# Patient Record
Sex: Female | Born: 1985 | Race: Black or African American | Hispanic: No | Marital: Single | Smoking: Never smoker
Health system: Southern US, Community
[De-identification: ages and names within clinical notes are randomized; demographics above are authoritative.]

## PROBLEM LIST (undated history)

## (undated) DIAGNOSIS — D259 Leiomyoma of uterus, unspecified: Secondary | ICD-10-CM

## (undated) DIAGNOSIS — Z309 Encounter for contraceptive management, unspecified: Secondary | ICD-10-CM

## (undated) HISTORY — DX: Leiomyoma of uterus, unspecified: D25.9

## (undated) HISTORY — DX: Encounter for contraceptive management, unspecified: Z30.9

---
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
## 2007-01-16 ENCOUNTER — Emergency Department (HOSPITAL_COMMUNITY): Admission: EM | Admit: 2007-01-16 | Discharge: 2007-01-16 | Payer: Self-pay | Admitting: Emergency Medicine

## 2007-04-14 ENCOUNTER — Emergency Department (HOSPITAL_COMMUNITY): Admission: EM | Admit: 2007-04-14 | Discharge: 2007-04-14 | Payer: Self-pay | Admitting: Emergency Medicine

## 2008-05-15 ENCOUNTER — Ambulatory Visit: Payer: Self-pay | Admitting: Family Medicine

## 2008-06-15 ENCOUNTER — Ambulatory Visit: Payer: Self-pay | Admitting: Family Medicine

## 2008-06-22 ENCOUNTER — Ambulatory Visit: Payer: Self-pay | Admitting: Family Medicine

## 2009-01-31 ENCOUNTER — Ambulatory Visit: Payer: Self-pay | Admitting: Family Medicine

## 2009-05-14 ENCOUNTER — Encounter: Payer: Self-pay | Admitting: Family Medicine

## 2009-05-14 ENCOUNTER — Other Ambulatory Visit: Admission: RE | Admit: 2009-05-14 | Discharge: 2009-05-14 | Payer: Self-pay | Admitting: Family Medicine

## 2009-05-14 ENCOUNTER — Ambulatory Visit: Payer: Self-pay | Admitting: Family Medicine

## 2009-05-14 LAB — HM PAP SMEAR: HM Pap smear: NEGATIVE

## 2010-03-07 ENCOUNTER — Ambulatory Visit: Payer: Self-pay | Admitting: Family Medicine

## 2010-08-14 ENCOUNTER — Ambulatory Visit: Payer: Self-pay | Admitting: Family Medicine

## 2011-05-19 DIAGNOSIS — R87619 Unspecified abnormal cytological findings in specimens from cervix uteri: Secondary | ICD-10-CM | POA: Insufficient documentation

## 2011-08-01 ENCOUNTER — Encounter: Payer: Self-pay | Admitting: Family Medicine

## 2011-08-04 ENCOUNTER — Encounter: Payer: Self-pay | Admitting: Medical

## 2011-08-04 ENCOUNTER — Ambulatory Visit (INDEPENDENT_AMBULATORY_CARE_PROVIDER_SITE_OTHER): Payer: BC Managed Care – PPO | Admitting: Medical

## 2011-08-04 DIAGNOSIS — G43909 Migraine, unspecified, not intractable, without status migrainosus: Secondary | ICD-10-CM

## 2011-08-04 MED ORDER — RIZATRIPTAN BENZOATE 10 MG PO TABS: 10.0000 mg | ORAL_TABLET | Freq: Once | ORAL | Status: DC | PRN

## 2011-08-04 NOTE — Patient Instructions (Signed)
Migraine Headache I want you to keep a headache diary for the next month.     Write down the following with your headaches:  Time of day  Triggers - stress, lack of sleep, smells, bright lights, etc.  Quality of headache - throbbing, aching, sharp, comes and goes, etc.  What did you do or take for the headache, and did it help?  Other factors you think are pertinent  Try and get 7+ hours of sleep nightly.  Eat a consistent and healthy diet.  Don't abruptly change caffeine intake.  I gave you samples of Maxalt today.  You can use 1 tablet with the onset of a bad headache.   If needed, you can repeat this in 2 hours.  Take no more than 2 of these in a 24 hour period.  A migraine headache is an intense, throbbing pain on one or both sides of your head. The exact cause of a migraine headache is not always known. A migraine may be caused when nerves in the brain become irritated and release chemicals that cause swelling (inflammation) within blood vessels, causing pain. Many migraine sufferers have a family history of migraines. Before you get a migraine you may or may not get an aura. An aura is a group of symptoms that can predict the beginning of a migraine. An aura may include:  Visual changes such as:   Flashing lights.   Seeing bright spots or zig-zag lines.   Tunnel vision.   Feelings of numbness.   Trouble talking.   Muscle weakness.  SYMPTOMS OF A MIGRAINE  A migraine headache has one or more of the following symptoms:  Pain on one or both sides of your head.   Pain that is pulsating or throbbing in nature.   Pain that is severe enough to prevent daily activities.   Pain that is aggravated by any daily physical activity.   Nausea (feeling sick to your stomach), vomiting or both.   Pain with exposure to bright lights, loud noises or activity.   General sensitivity to bright lights or loud noises.  MIGRAINE TRIGGERS A migraine headache can be triggered by many  things. Examples of triggers include:   Alcohol.   Smoking.   Stress.   It may be related to menses (female menstruation).   Aged cheeses.   Foods or drinks that contain nitrates, glutamate, aspartame or tyramine.   Lack of sleep.   Chocolate.   Caffeine.   Hunger.   Medications such as nitroglycerine (used to treat chest pain), birth control pills, estrogen and some blood pressure medications.  DIAGNOSIS  A migraine headache is often diagnosed based on:   Your symptoms.   Physical examination.   A CT scan of your head may be ordered to see if your headaches are caused from other medical conditions.  HOME CARE INSTRUCTIONS  Medications can help prevent migraines if they are recurrent or should they become recurrent. Your caregiver can help you with a medication or treatment program that will be helpful to you.   If you get a migraine, it may be helpful to lie down in a dark, quiet room.   It may be helpful to keep a headache diary. This may help you find a trend as to what may be triggering your headaches.  SEEK IMMEDIATE MEDICAL CARE IF:   You do not get relief from the medications given to you or you have a recurrence of pain.   You have confusion, personality changes  or seizures.   You have headaches that wake you from sleep.   You have an increased frequency in your headaches.   You have a stiff neck.   You have a loss of vision.   You have muscle weakness.   You start losing your balance or have trouble walking.   You feel faint or pass out.  MAKE SURE YOU:   Understand these instructions.   Will watch your condition.   Will get help right away if you are not doing well or get worse.  Document Released: 11/24/2005 Document Re-Released: 09/21/2009 [facility] Patient Information 2011 [place], [place].

## 2011-08-04 NOTE — Progress Notes (Signed)
  Subjective:    Stephanie Austin is a 25 y.o. female who presents for evaluation of headache. Symptoms began about 2 years ago ago. Generally, the headaches last hours to 1-2 days, but recently has gotten bad headache lasting 4 days. Lately getting headaches about 2x/wk. The headaches are usually worse in the evening. The headaches are usually throbbing and are located in frontal to bitemporal region.  Gets some photophobia and phonophobia.  Usually not affected by periods.   She denies changes in caffeine, no fasting, no allergy problems.  She does drink 3 colas daily.  Work attendance or other daily activities are affected by the headaches.  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  She notes getting about 5.5 hours of sleep nightly. Precipitating factors include: none which have been determined. The headaches are usually not preceded by an aura. Associated neurologic symptoms: none. The patient denies decreased physical activity, depression, dizziness, muscle weakness, numbness of extremities, speech difficulties and vision problems. Home treatment has included Ibuprofen, Tylenol, Excedrin with some improvement. Other history includes: nothing pertinent. Family history includes brohter wiht hx/o migraines.  The following portions of the patient's history were reviewed and updated as appropriate: allergies, current medications, past family history, past medical history, past social history, past surgical history and problem list.  Review of Systems General: No fevers, chills, sweats, weight changes Skin: No rash, skin changes HEENT: No ear pain, eye changes, sore throat, congestion Lungs: No cough, shortness of breath Heart: No chest pain, palpitations Abdomen: No abdominal pain, nausea, vomiting, bowel changes Neurology: No numbness, weakness, tingling, vision changes, hearing changes, speech changes   Objective:   Gen: Alert, well-nourished, no acute distress HEENT: conjunctiva normal, TMs  pearly, nares patent, pharynx normal Neck: No JVD, no bruit, supple, no thyromegaly, no masses Lungs: Clear to auscultation bilaterally Heart: RRR, normal S1, S2 Ext: no edema Neuro: CN II through XII intact, no focal change   Assessment:     Encounter Diagnosis  Name Primary?  . Migraine Yes     Plan:  Advise that her symptoms are most suggestive of migraines.  Advise she keep a headache diary.  Gave samples of Maxalt for her to try with the onset of next migraine.  Advise she work on sleep hygiene, try and get 7+ hours of sleep nightly, eat a consistent diet, no sudden changes in caffeine.  Use over-the-counter analgesics sparingly.  Recheck in one month

## 2011-09-03 ENCOUNTER — Ambulatory Visit: Payer: BC Managed Care – PPO | Admitting: Medical

## 2015-03-16 ENCOUNTER — Other Ambulatory Visit: Payer: Self-pay | Admitting: Obstetrics and Gynecology

## 2015-03-16 DIAGNOSIS — E049 Nontoxic goiter, unspecified: Secondary | ICD-10-CM

## 2015-03-21 ENCOUNTER — Other Ambulatory Visit: Payer: Self-pay

## 2015-03-23 ENCOUNTER — Ambulatory Visit
Admission: RE | Admit: 2015-03-23 | Discharge: 2015-03-23 | Disposition: A | Discharge status: Home / Self Care | Payer: Managed Care, Other (non HMO) | Source: Ambulatory Visit | Attending: Obstetrics and Gynecology | Admitting: Obstetrics and Gynecology

## 2015-03-23 DIAGNOSIS — E049 Nontoxic goiter, unspecified: Secondary | ICD-10-CM

## 2015-04-20 ENCOUNTER — Encounter: Payer: Self-pay | Admitting: Endocrinology

## 2015-04-20 ENCOUNTER — Ambulatory Visit (INDEPENDENT_AMBULATORY_CARE_PROVIDER_SITE_OTHER): Payer: Managed Care, Other (non HMO) | Admitting: Endocrinology

## 2015-04-20 VITALS — BP 136/80 | HR 101 | Temp 98.6°F | Ht 63.0 in | Wt 209.0 lb

## 2015-04-20 DIAGNOSIS — R0600 Dyspnea, unspecified: Secondary | ICD-10-CM | POA: Diagnosis not present

## 2015-04-20 NOTE — Progress Notes (Signed)
   Subjective:    Patient ID: Stephanie Austin, female    DOB: 09/15/1986, 29 y.o.   MRN: [id number]  HPI Pt states 1 month of slight swelling at the anterior neck, but no assoc pain.  She has no h/o XRT or surgery to the neck.   Past Medical History  Diagnosis Date  . Contraception management     No past surgical history on file.  History   Social History  . Marital Status: Single    Spouse Name: N/A  . Number of Children: N/A  . Years of Education: N/A   Occupational History  . Not on file.   Social History Main Topics  . Smoking status: Never Smoker   . Smokeless tobacco: Never Used  . Alcohol Use: Yes     Comment: 2 drinks a month  . Drug Use: No  . Sexual Activity: Not on file   Other Topics Concern  . Not on file   Social History Narrative    Current Outpatient Prescriptions on File Prior to Visit  Medication Sig Dispense Refill  . rizatriptan (MAXALT) 10 MG tablet Take 1 tablet (10 mg total) by mouth once as needed for migraine. May repeat in 2 hours if needed 2 tablet 0   No current facility-administered medications on file prior to visit.    No Known Allergies  Family History  Problem Relation Age of Onset  . Thyroid disease Mother     BP 136/80 mmHg  Pulse 101  Temp(Src) 98.6 F (37 C) (Oral)  Ht 5\' 3"  (1.6 m)  Wt 209 lb (94.802 kg)  BMI 37.03 kg/m2  SpO2 97%     Review of Systems She has slight doe.  No dysphagia.     Objective:   Physical Exam VITAL SIGNS:  See vs page GENERAL: no distress NECK: There is slight diffuse palpable thyroid enlargement.  No thyroid nodule is palpable.  No palpable lymphadenopathy at the anterior neck.   CHEST WALL: no deformity MUSCULOSKELETAL: muscle bulk and strength are grossly normal.  no obvious joint swelling.  gait is normal and steady EXTEMITIES: no edema NEURO:  cn 2-12 grossly intact.   readily moves all 4's.  sensation is intact to touch on the feet SKIN:  Normal texture and temperature.  No  rash or suspicious lesion is visible.   NODES:  None palpable at the neck PSYCH: alert, well-oriented.  Does not appear anxious nor depressed.  i reviewed thyroid US report.  i personally reviewed spirometry tracing: normal  outside test results are reviewed: TSH=normal    Assessment & Plan:  Goiter, diffuse: prob autoimmune. Mild sob: new to me: not thyroid-related  Patient is advised the following: Patient Instructions  Your thyroid level is normal now.  However, it will probably become abnormal with time, so you should have annual thyroid blood tests.  You should also have a PCP examine your neck each year.  I would be happy to see you back here whenever you want.    Cc elmira powell

## 2015-04-20 NOTE — Patient Instructions (Addendum)
Your thyroid level is normal now.  However, it will probably become abnormal with time, so you should have annual thyroid blood tests.  You should also have a PCP examine your neck each year.  I would be happy to see you back here whenever you want.    Cc elmira powell

## 2017-01-15 DIAGNOSIS — R7309 Other abnormal glucose: Secondary | ICD-10-CM | POA: Insufficient documentation

## 2017-01-15 DIAGNOSIS — E049 Nontoxic goiter, unspecified: Secondary | ICD-10-CM | POA: Insufficient documentation

## 2018-01-04 DIAGNOSIS — E781 Pure hyperglyceridemia: Secondary | ICD-10-CM | POA: Insufficient documentation

## 2018-10-26 ENCOUNTER — Other Ambulatory Visit: Payer: Self-pay

## 2018-10-26 ENCOUNTER — Encounter (HOSPITAL_COMMUNITY): Payer: Self-pay

## 2018-10-26 ENCOUNTER — Inpatient Hospital Stay (HOSPITAL_COMMUNITY)
Admission: AD | Admit: 2018-10-26 | Discharge: 2018-10-26 | Disposition: A | Discharge status: Home / Self Care | Payer: Managed Care, Other (non HMO) | Source: Ambulatory Visit | Attending: Obstetrics & Gynecology | Admitting: Obstetrics & Gynecology

## 2018-10-26 DIAGNOSIS — R102 Pelvic and perineal pain: Secondary | ICD-10-CM

## 2018-10-26 DIAGNOSIS — R109 Unspecified abdominal pain: Secondary | ICD-10-CM | POA: Insufficient documentation

## 2018-10-26 DIAGNOSIS — N852 Hypertrophy of uterus: Secondary | ICD-10-CM | POA: Diagnosis not present

## 2018-10-26 MED ORDER — NAPROXEN 500 MG PO TBEC: 500.0000 mg | DELAYED_RELEASE_TABLET | Freq: Two times a day (BID) | ORAL | 3 refills | Status: DC

## 2018-10-26 MED ORDER — HYDROCODONE-ACETAMINOPHEN 5-325 MG PO TABS: 1.0000 | ORAL_TABLET | Freq: Four times a day (QID) | ORAL | 0 refills | Status: AC | PRN

## 2018-10-26 NOTE — MAU Provider Note (Signed)
History     CSN: [id number]  Arrival date and time: 10/26/18 1539   Chief Complaint  Patient presents with  . Abdominal Pain   HPI  32 year old P0 presents via ambulance as a transfer of care from [facility] Emergency Department for severe abdominal pain that began at 6am this morning.  Patient noted it to be severe sharp, worse in the RLQ.  She notes that she has never had this type of pain in the past.  She has known fibroid uterus that has grown in size in the past two years.  She is on continuous Norethindrone.  She denies abnormal uterine bleeding, states she has always had normal menses.   She has a history of severe dysmenorrhea and dyspareunia.   She denies nausea or vomiting.  She felt hot earlier but now she feels better after being medicated and has no pain.    She reports normal urinary and defecatory function. Denies constipation, melena or hematochezia. She does have mild early satiety. She has some LE weakness and numbness at times.   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 Contraception management     History reviewed. No pertinent surgical history.  Family History  Problem Relation Age of Onset  . Thyroid disease Mother     Social History   Tobacco Use  . Smoking status: Never Smoker  . Smokeless tobacco: Never Used  Substance Use Topics  . Alcohol use: Not Currently    Comment: 2 drinks a month  . Drug use: No    Allergies: No Known Allergies  Medications Prior to Admission  Medication Sig Dispense Refill Last Dose  . CAMILA 0.35 MG tablet    10/25/2018 at Unknown time  . Multiple  Vitamins-Minerals (MULTIVITAMIN GUMMIES ADULT) CHEW Chew 1 tablet by mouth daily.   10/26/2018 at Unknown time  . Omega-3 Fatty Acids (FISH OIL ADULT GUMMIES PO) Take 2 tablets by mouth daily.   10/25/2018 at Unknown time    Review of Systems  As per HPI  Physical Exam   Blood pressure 130/82, pulse (!) 107, temperature 98 F (36.7 C), resp. rate 12, SpO2 97 %.  Physical Exam  Abdomen: Soft, NT, Non-distended, palpable fundus just beneath the umbilicus.  The uterus is palpable to be broad encompassing the entire lower abdomen / pelvis  Pelvic exam deferred  CT scan from [facility]:  Normal appendix Severely enlarged heterogeneous uterus with numerous masses with the overall uterus measuring apprx 8.9x17x13.9 cm concerning for numerous leiomyoma with myxoid degeneration versus leiomyosarcoma  Assessment and Plan  32 yo with markedly enlarged uterus, fast growing. Pain is controlled currently. Discussed with patient that the pain is most likely inflammatory due to fibroid degeneration and will send home with NSAIDs and PRN narcotic pain medication.   We discussed at length the possible diagnosis of leiomyosarcoma and the potential indication.  We reviewed that the fact that she does not have abnormal uterine bleeding is reassuring. However I am referring her to Dr. Everitt Amber, GYN Oncologist at [facility].  Discussed case with Dr. Denman George via phone and patient has an appointment Monday at 1300.  We discussed that she will probably need further testing  to include endometrial biopsy, possible MRI.  We also discussed preserving her fertility versus performing a hysterectomy.  Patient is unsure about her future fertility at this time, but she is obviously a young, single nulliparous patient.   Will fax over her records to the [facility].    Miette Molenda, [place] 10/26/2018, 5:01 PM

## 2018-10-26 NOTE — MAU Note (Signed)
Urine sent to lab 

## 2018-10-26 NOTE — MAU Note (Signed)
Pt states she is here to be further evaluated for her uterine sarcoma.   Pt transferred from [facility] Emergency Department.

## 2018-11-01 ENCOUNTER — Encounter: Payer: Self-pay | Admitting: Gynecologic Oncology

## 2018-11-01 ENCOUNTER — Inpatient Hospital Stay: Payer: Managed Care, Other (non HMO) | Attending: Gynecologic Oncology | Admitting: Gynecologic Oncology

## 2018-11-01 VITALS — BP 148/90 | HR 109 | Temp 98.8°F | Resp 20 | Wt 198.3 lb

## 2018-11-01 DIAGNOSIS — D252 Subserosal leiomyoma of uterus: Secondary | ICD-10-CM

## 2018-11-01 DIAGNOSIS — D251 Intramural leiomyoma of uterus: Secondary | ICD-10-CM | POA: Diagnosis not present

## 2018-11-01 DIAGNOSIS — D250 Submucous leiomyoma of uterus: Secondary | ICD-10-CM | POA: Diagnosis not present

## 2018-11-01 NOTE — Progress Notes (Signed)
Consult Note: Gyn-Onc  Consult was requested by Dr. Alwyn Pea for the evaluation of Stephanie Austin 32 y.o. female  CC:  Chief Complaint  Patient presents with  . Fibroids    consult for possible leiomyosarcoma    Assessment/Plan:  Stephanie Austin  is a 37 y.o.  year old with rapidly enlarging uterine fibroids associated with intermittent pain.  Patient provided her disc from her recent CT scan which we will submit to radiology for comparison.  I have ordered an MRI of the pelvis to better evaluate the characterization of these fibroids.  I discussed the limitations of the MRI and that it is not diagnostic for confirming leiomyosarcoma versus benign fibroids.  However, it will give [place] some better classification of her anatomy.  Additionally we will follow-up the biopsy results from today.  I discussed the limitation of endometrial biopsy in the diagnosis of leiomyosarcoma (sensitivity 50%) however if positive this will establish the diagnosis and we will better understand how to proceed.  I discussed that there are 3 options moving forward are either #1 if the MRI of biopsy are reassuring we can either move towards close follow-up or abdominal myomectomy both of which of fertility preserving options.  If the MRI or biopsy are concerning we should proceed with an abdominal hysterectomy.  I feel that this might be able to be done through a combined approach of minimally invasive surgery for mobilization of the vascular structures and colpotomy and then mini laparotomy for an intact specimen removal (no morcellation).  This might enable a less morbid, low transverse incision as part of the procedure.  I discussed that hysterectomy result in permanent infertility.  We will any entertain this option if safe myomectomy or close observation is not reasonable.  HPI: Stephanie Austin is a 48-year-old P0 who was seen in consultation at the request of Dr.Pinn rapidly enlarging uterine fibroids.  The patient is  known to have fibroid uterus since at least 2016 when she had an ultrasound scan.  She presented to her gynecologist with pain in September 2019 and a transvaginal ultrasound was performed at that time which showed a uterus measuring 13.5 cm from fundus to external awes with multiple intramural subserosal and submucosal fibroids.  The endometrium appeared grossly normal.  She denied abnormal vaginal bleeding.  The ovaries were normal.  The pain resolved and no intervention was taken at that time.  However, on October 26, 2018 she developed severe right lower quadrant pain with tenderness and underwent a CT scan of the abdomen and pelvis.  This was performed at [facility] rocking him.  It revealed an enlarged heterogeneous uterus with numerous masses.  The uterus measured 8.9 x 17.13 0.9 cm.  It was concerning in the report for either fibroids versus leiomyosarcoma.  There are no other abnormality seen within the abdomen and pelvis.  The appendix was normal.  The radiologist felt there were concerning fibroids for myxoid degeneration.  After 24 hours the pain resolved and patient now feels well.  She denies abnormal uterine bleeding.  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  Her last Pap in September 2019 was ASCUS but with negative high-risk HPV.  She is a non-smoker.  She does not take regular medications other than an oral contraceptive pill for contraception.  Current Meds:  Outpatient Encounter Medications as of 11/01/2018  Medication Sig  . CAMILA  0.35 MG tablet   . HYDROcodone-acetaminophen (NORCO/VICODIN) 5-325 MG tablet Take 1-2 tablets by mouth every 6 (six) hours as needed for up to 7 days for moderate pain or severe pain.  . Multiple Vitamins-Minerals (MULTIVITAMIN GUMMIES ADULT) CHEW Chew 1 tablet by mouth daily.  . naproxen (EC-NAPROXEN) 500 MG EC tablet Take 1 tablet (500 mg total) by mouth every 12  (twelve) hours. Do not take along with other anti-inflammatories such as advil or ibuprofen  . Omega-3 Fatty Acids (FISH OIL ADULT GUMMIES PO) Take 2 tablets by mouth daily.   No facility-administered encounter medications on file as of 11/01/2018.     Allergy: No Known Allergies  Social Hx:   Social History   Socioeconomic History  . Marital status: Single    Spouse name: Not on file  . Number of children: Not on file  . Years of education: Not on file  . Highest education level: Not on file  Occupational History  . Not on file  Social Needs  . Financial resource strain: Not on file  . Food insecurity:    Worry: Not on file    Inability: Not on file  . Transportation needs:    Medical: Not on file    Non-medical: Not on file  Tobacco Use  . Smoking status: Never Smoker  . Smokeless tobacco: Never Used  Substance and Sexual Activity  . Alcohol use: Not Currently    Comment: 2 drinks a month  . Drug use: No  . Sexual activity: Not Currently    Birth control/protection: Pill  Lifestyle  . Physical activity:    Days per week: Not on file    Minutes per session: Not on file  . Stress: Not on file  Relationships  . Social connections:    Talks on phone: Not on file    Gets together: Not on file    Attends religious service: Not on file    Active member of club or organization: Not on file    Attends meetings of clubs or organizations: Not on file    Relationship status: Not on file  . Intimate partner violence:    Fear of current or ex partner: Not on file    Emotionally abused: Not on file    Physically abused: Not on file    Forced sexual activity: Not on file  Other Topics Concern  . Not on file  Social History Narrative  . Not on file    Past Surgical Hx: History reviewed. No pertinent surgical history.  Past Medical Hx:  Past Medical History:  Diagnosis Date  . Contraception management   . Fibroid uterus     Past Gynecological History:  See HPI No  LMP recorded.  Family Hx:  Family History  Problem Relation Age of Onset  . Thyroid disease Mother     Review of Systems:  Constitutional  Feels well,    ENT Normal appearing ears and nares bilaterally Skin/Breast  No rash, sores, jaundice, itching, dryness Cardiovascular  No chest pain, shortness of breath, or edema  Pulmonary  No cough or wheeze.  Gastro Intestinal  No nausea, vomitting, or diarrhoea. No bright red blood per rectum, no abdominal pain, change in bowel movement, or constipation.  Genito Urinary  No frequency, urgency, dysuria,  Musculo Skeletal  No myalgia, arthralgia, joint swelling or pain  Neurologic  No weakness, numbness, change in gait,  Psychology  No depression, anxiety, insomnia.   Vitals:  Blood pressure (!) 148/90,  pulse (!) 109, temperature 98.8 F (37.1 C), temperature source Oral, resp. rate 20, weight 198 lb 5 oz (90 kg).  Physical Exam: WD in NAD Neck  Supple NROM, without any enlargements.  Lymph Node Survey No cervical supraclavicular or inguinal adenopathy Cardiovascular  Pulse normal rate, regularity and rhythm. S1 and S2 normal.  Lungs  Clear to auscultation bilateraly, without wheezes/crackles/rhonchi. Good air movement.  Skin  No rash/lesions/breakdown  Psychiatry  Alert and oriented to person, place, and time  Abdomen  Normoactive bowel sounds, abdomen soft, non-tender and mildly obese without evidence of hernia. Can feel uterus in lower left abdomin - mobile. Back No CVA tenderness Genito Urinary  Vulva/vagina: Normal external female genitalia.  No lesions. No discharge or bleeding.  Bladder/urethra:  No lesions or masses, well supported bladder  Vagina: normal  Cervix: Normal appearing, no lesions. Flush with vagina  Uterus: Bulky, mobile, no parametrial involvement or nodularity. Deviates to left. Myoma in lower uterine segment/cervix.  Adnexa: no discrete masses. Rectal  deferred Extremities  No bilateral  cyanosis, clubbing or edema.  Procedure Note:  Preop Dx: uterine fibroids, abnormal bleeding Postop Dx: same Procedure: endometrial biopsy Surgeon: Dorann Ou, MD EBL: minimal Specimens: endometrial biopsy to pathology Complications: none Procedure Details: Patient provided verbal consent, verbal timeout was performed.  Speculum was placed in the vagina and the cervix was visualized.  A Pipelle was passed a single time through the uterine cervix to a depth of 10 cm.  It was aspirated and a small amount of tissue was retrieved.  The pelvis removed as was a speculum.  The patient tolerated procedure well.    Thereasa Solo, MD  11/01/2018, 2:34 PM

## 2018-11-01 NOTE — Progress Notes (Signed)
See letter.

## 2018-11-01 NOTE — Patient Instructions (Addendum)
We will contact you with the results of your endometrial biopsy from today.  You may experience vaginal spotting from the biopsy.  We will also arrange for you to have an MRI of the pelvis and contact you with the results and Dr. Serita Grit recommendations.  Please call for any questions or concerns.   Endometrial Biopsy, Care After This sheet gives you information about how to care for yourself after your procedure. Your health care provider may also give you more specific instructions. If you have problems or questions, contact your health care provider. What can I expect after the procedure? After the procedure, it is common to have:  Mild cramping.  A small amount of vaginal bleeding for a few days. This is normal.  Follow these instructions at home:  Take over-the-counter and prescription medicines only as told by your health care provider.  Do not douche, use tampons, or have sexual intercourse until your health care provider approves.  Return to your normal activities as told by your health care provider. Ask your health care provider what activities are safe for you.  Follow instructions from your health care provider about any activity restrictions, such as restrictions on strenuous exercise or heavy lifting. Contact a health care provider if:  You have heavy bleeding, or bleed for longer than 2 days after the procedure.  You have bad smelling discharge from your vagina.  You have a fever or chills.  You have a burning sensation when urinating or you have difficulty urinating.  You have severe pain in your lower abdomen. Get help right away if:  You have severe cramps in your stomach or back.  You pass large blood clots.  Your bleeding increases.  You become weak or light-headed, or you pass out. Summary  After the procedure, it is common to have mild cramping and a small amount of vaginal bleeding for a few days.  Do not douche, use tampons, or have sexual  intercourse until your health care provider approves.  Return to your normal activities as told by your health care provider. Ask your health care provider what activities are safe for you. This information is not intended to replace advice given to you by your health care provider. Make sure you discuss any questions you have with your health care provider. Document Released: 09/14/2013 Document Revised: 12/10/2016 Document Reviewed: 12/10/2016 Elsevier Interactive Patient Education  2017   We are saving OR time on November 18, 2018 Preparing for your Surgery  Plan for surgery on with Dr. Everitt Amber at [facility] will be scheduled for a robotic assisted total hysterectomy, bilateral salpingectomy, mini-laparotomy.   Pre-operative Testing -You will receive a phone call from presurgical testing at [facility] to arrange for a pre-operative testing appointment before your surgery.  This appointment normally occurs one to two weeks before your scheduled surgery.   -Bring your insurance card, copy of an advanced directive if applicable, medication list  -At that visit, you will be asked to sign a consent for a possible blood transfusion in case a transfusion becomes necessary during surgery.  The need for a blood transfusion is rare but having consent is a necessary part of your care.     -You should not be taking blood thinners or aspirin at least ten days prior to surgery unless instructed by your surgeon.  Day Before Surgery at [facility] will be asked to take in a light diet the day before surgery.  Avoid carbonated beverages.  You will be advised to have nothing to eat or drink after midnight the evening before.    Eat a light diet the day before surgery.  Examples including soups, broths, toast, yogurt, mashed potatoes.  Things to avoid include carbonated beverages (fizzy beverages), raw fruits and raw vegetables, or beans.   If your bowels are filled with gas,  your surgeon will have difficulty visualizing your pelvic organs which increases your surgical risks.  Your role in recovery Your role is to become active as soon as directed by your doctor, while still giving yourself time to heal.  Rest when you feel tired. You will be asked to do the following in order to speed your recovery:  - Cough and breathe deeply. This helps toclear and expand your lungs and can prevent pneumonia. You may be given a spirometer to practice deep breathing. A staff member will show you how to use the spirometer. - Do mild physical activity. Walking or moving your legs help your circulation and body functions return to normal. A staff member will help you when you try to walk and will provide you with simple exercises. Do not try to get up or walk alone the first time. - Actively manage your pain. Managing your pain lets you move in comfort. We will ask you to rate your pain on a scale of zero to 10. It is your responsibility to tell your doctor or nurse where and how much you hurt so your pain can be treated.  Special Considerations -If you are diabetic, you may be placed on insulin after surgery to have closer control over your blood sugars to promote healing and recovery.  This does not mean that you will be discharged on insulin.  If applicable, your oral antidiabetics will be resumed when you are tolerating a solid diet.  -Your final pathology results from surgery should be available around one week after surgery and the results will be relayed to you when available.  -Dr. Lahoma Crocker is the Surgeon that assists your GYN Oncologist with surgery.  The next day after your surgery you will either see your GYN Oncologist, Dr. Precious Haws, or Dr. Lahoma Crocker.  -FMLA forms can be faxed to [phone] and please allow 5-7 business days for completion.   Blood Transfusion Information WHAT IS A BLOOD TRANSFUSION? A transfusion is the replacement of blood or  some of its parts. Blood is made up of multiple cells which provide different functions.  Red blood cells carry oxygen and are used for blood loss replacement.  White blood cells fight against infection.  Platelets control bleeding.  Plasma helps clot blood.  Other blood products are available for specialized needs, such as hemophilia or other clotting disorders. BEFORE THE TRANSFUSION  Who gives blood for transfusions?   You may be able to donate blood to be used at a later date on yourself (autologous donation).  Relatives can be asked to donate blood. This is generally not any safer than if you have received blood from a stranger. The same precautions are taken to ensure safety when a relative's blood is donated.  Healthy volunteers who are fully evaluated to make sure their blood is safe. This is blood bank blood. Transfusion therapy is the safest it has ever been in the practice of medicine. Before blood is taken from a donor, a complete history is taken to make sure that person has no history of diseases nor engages in risky social behavior (examples are intravenous drug  use or sexual activity with multiple partners). The donor's travel history is screened to minimize risk of transmitting infections, such as malaria. The donated blood is tested for signs of infectious diseases, such as HIV and hepatitis. The blood is then tested to be sure it is compatible with you in order to minimize the chance of a transfusion reaction. If you or a relative donates blood, this is often done in anticipation of surgery and is not appropriate for emergency situations. It takes many days to process the donated blood. RISKS AND COMPLICATIONS Although transfusion therapy is very safe and saves many lives, the main dangers of transfusion include:   Getting an infectious disease.  Developing a transfusion reaction. This is an allergic reaction to something in the blood you were given. Every precaution is  taken to prevent this. The decision to have a blood transfusion has been considered carefully by your caregiver before blood is given. Blood is not given unless the benefits outweigh the risks.

## 2018-11-05 ENCOUNTER — Telehealth: Payer: Self-pay | Admitting: *Deleted

## 2018-11-05 ENCOUNTER — Ambulatory Visit (HOSPITAL_COMMUNITY)
Admission: RE | Admit: 2018-11-05 | Discharge: 2018-11-05 | Disposition: A | Discharge status: Home / Self Care | Payer: Managed Care, Other (non HMO) | Source: Ambulatory Visit | Attending: Gynecologic Oncology | Admitting: Gynecologic Oncology

## 2018-11-05 DIAGNOSIS — D251 Intramural leiomyoma of uterus: Secondary | ICD-10-CM | POA: Diagnosis present

## 2018-11-05 DIAGNOSIS — D252 Subserosal leiomyoma of uterus: Secondary | ICD-10-CM | POA: Insufficient documentation

## 2018-11-05 DIAGNOSIS — D259 Leiomyoma of uterus, unspecified: Secondary | ICD-10-CM | POA: Insufficient documentation

## 2018-11-05 DIAGNOSIS — D250 Submucous leiomyoma of uterus: Secondary | ICD-10-CM | POA: Diagnosis present

## 2018-11-05 MED ORDER — GADOBUTROL 1 MMOL/ML IV SOLN
9.0000 mL | Freq: Once | INTRAVENOUS | Status: AC | PRN
  Administered 2018-11-05: 9 mL via INTRAVENOUS

## 2018-11-05 NOTE — Telephone Encounter (Signed)
Patient stated that she dropped off FMLA forms to the front desk.

## 2018-11-09 ENCOUNTER — Inpatient Hospital Stay: Payer: Managed Care, Other (non HMO) | Attending: Gynecologic Oncology | Admitting: Gynecologic Oncology

## 2018-11-09 ENCOUNTER — Encounter: Payer: Self-pay | Admitting: Gynecologic Oncology

## 2018-11-09 VITALS — BP 142/85 | HR 91 | Temp 98.7°F | Resp 20

## 2018-11-09 DIAGNOSIS — D252 Subserosal leiomyoma of uterus: Secondary | ICD-10-CM | POA: Insufficient documentation

## 2018-11-09 DIAGNOSIS — Z7189 Other specified counseling: Secondary | ICD-10-CM

## 2018-11-09 DIAGNOSIS — D250 Submucous leiomyoma of uterus: Secondary | ICD-10-CM | POA: Insufficient documentation

## 2018-11-09 DIAGNOSIS — D251 Intramural leiomyoma of uterus: Secondary | ICD-10-CM | POA: Insufficient documentation

## 2018-11-09 NOTE — Progress Notes (Signed)
Patient in the office with her mother for pre-op discussion.  She reports no changes since being last seen in the office.  Discussed the results of the endometrial biopsy taken in the office at her last visit with the results showing benign endocervical tissue.  Discussed MRI in detail including reviewing images.  Advised patient that her MRI had been reviewed by Dr. Denman George who stated that a myomectomy could be a possibility in order to preserve fertility.  Patient stating she would need some time to think which surgical route she would like to take.  All questions answered.  Patient asking about risks of surgery and what instructions would need to be followed after surgery.    Advised she would be contacted by Dr. Denman George via phone to discuss more in detail.  No concerns voiced at the visit.  Information given on the myomectomy procedure for her review. Advised to call for any needs.

## 2018-11-10 NOTE — Patient Instructions (Signed)
Stephanie Austin  11/10/2018   Your procedure is scheduled on: 11-18-18  Report to [facility] Main  Entrance  Report to admitting at      1100 AM    Call this number if you have problems the morning of surgery [phone]    Remember: Eat a light diet the day before surgery.  Examples including soups, broths, toast, yogurt, mashed potatoes.  Things to avoid include carbonated beverages (fizzy beverages), raw fruits and raw vegetables, or beans.   If your bowels are filled with gas, your surgeon will have difficulty visualizing your pelvic organs which increases your surgical risks.   NO SOLID FOOD AFTER MIDNIGHT THE NIGHT PRIOR TO SURGERY. NOTHING BY MOUTH EXCEPT CLEAR LIQUIDS UNTIL 3 HOURS PRIOR TO Long SURGERY. PLEASE FINISH ENSURE DRINK PER SURGEON ORDER 3 HOURS PRIOR TO SCHEDULED SURGERY TIME WHICH NEEDS TO BE COMPLETED AT ____________.1000 am then nothing by mouth     CLEAR LIQUID DIET   Foods Allowed                                                                     Foods Excluded  Coffee and tea, regular and decaf                             liquids that you cannot  Plain Jell-O in any flavor                                             see through such as: Fruit ices (not with fruit pulp)                                     milk, soups, orange juice  Iced Popsicles                                    All solid food Carbonated beverages, regular and diet                                    Cranberry, grape and apple juices Sports drinks like Gatorade Lightly seasoned clear broth or consume(fat free) Sugar, honey syrup   _____________________________________________________________________    BRUSH YOUR TEETH MORNING OF SURGERY AND RINSE YOUR MOUTH OUT, NO CHEWING GUM CANDY OR MINTS.     Take these medicines the morning of surgery with A SIP OF WATER: none                                You may not have any metal on your body including hair  pins and              piercings  Do not wear jewelry, make-up, lotions,  powders or perfumes, deodorant             Do not wear nail polish.  Do not shave  48 hours prior to surgery.            Do not bring valuables to the hospital. [facility].  Contacts, dentures or bridgework may not be worn into surgery.  Leave suitcase in the car. After surgery it may be brought to your room.                Please read over the following fact sheets you were given: _____________________________________________________________________         [facility] - Preparing for Surgery Before surgery, you can play an important role.  Because skin is not sterile, your skin needs to be as free of germs as possible.  You can reduce the number of germs on your skin by washing with CHG (chlorahexidine gluconate) soap before surgery.  CHG is an antiseptic cleaner which kills germs and bonds with the skin to continue killing germs even after washing. Please DO NOT use if you have an allergy to CHG or antibacterial soaps.  If your skin becomes reddened/irritated stop using the CHG and inform your nurse when you arrive at Short Stay. Do not shave (including legs and underarms) for at least 48 hours prior to the first CHG shower.  You may shave your face/neck. Please follow these instructions carefully:  1.  Shower with CHG Soap the night before surgery and the  morning of Surgery.  2.  If you choose to wash your hair, wash your hair first as usual with your  normal  shampoo.  3.  After you shampoo, rinse your hair and body thoroughly to remove the  shampoo.                           4.  Use CHG as you would any other liquid soap.  You can apply chg directly  to the skin and wash                       Gently with a scrungie or clean washcloth.  5.  Apply the CHG Soap to your body ONLY FROM THE NECK DOWN.   Do not use on face/ open                           Wound or open sores.  Avoid contact with eyes, ears mouth and genitals (private parts).                       Wash face,  Genitals (private parts) with your normal soap.             6.  Wash thoroughly, paying special attention to the area where your surgery  will be performed.  7.  Thoroughly rinse your body with warm water from the neck down.  8.  DO NOT shower/wash with your normal soap after using and rinsing off  the CHG Soap.                9.  Pat yourself dry with a clean towel.            10.  Wear clean pajamas.  11.  Place clean sheets on your bed the night of your first shower and do not  sleep with pets. Day of Surgery : Do not apply any lotions/deodorants the morning of surgery.  Please wear clean clothes to the hospital/surgery center.  FAILURE TO FOLLOW THESE INSTRUCTIONS MAY RESULT IN THE CANCELLATION OF YOUR SURGERY PATIENT SIGNATURE_________________________________  NURSE SIGNATURE__________________________________  ________________________________________________________________________  WHAT IS A BLOOD TRANSFUSION? Blood Transfusion Information  A transfusion is the replacement of blood or some of its parts. Blood is made up of multiple cells which provide different functions.  Red blood cells carry oxygen and are used for blood loss replacement.  White blood cells fight against infection.  Platelets control bleeding.  Plasma helps clot blood.  Other blood products are available for specialized needs, such as hemophilia or other clotting disorders. BEFORE THE TRANSFUSION  Who gives blood for transfusions?   Healthy volunteers who are fully evaluated to make sure their blood is safe. This is blood bank blood. Transfusion therapy is the safest it has ever been in the practice of medicine. Before blood is taken from a donor, a complete history is taken to make sure that person has no history of diseases nor engages in risky social behavior (examples are intravenous drug use  or sexual activity with multiple partners). The donor's travel history is screened to minimize risk of transmitting infections, such as malaria. The donated blood is tested for signs of infectious diseases, such as HIV and hepatitis. The blood is then tested to be sure it is compatible with you in order to minimize the chance of a transfusion reaction. If you or a relative donates blood, this is often done in anticipation of surgery and is not appropriate for emergency situations. It takes many days to process the donated blood. RISKS AND COMPLICATIONS Although transfusion therapy is very safe and saves many lives, the main dangers of transfusion include:   Getting an infectious disease.  Developing a transfusion reaction. This is an allergic reaction to something in the blood you were given. Every precaution is taken to prevent this. The decision to have a blood transfusion has been considered carefully by your caregiver before blood is given. Blood is not given unless the benefits outweigh the risks. AFTER THE TRANSFUSION  Right after receiving a blood transfusion, you will usually feel much better and more energetic. This is especially true if your red blood cells have gotten low (anemic). The transfusion raises the level of the red blood cells which carry oxygen, and this usually causes an energy increase.  The nurse administering the transfusion will monitor you carefully for complications. HOME CARE INSTRUCTIONS  No special instructions are needed after a transfusion. You may find your energy is better. Speak with your caregiver about any limitations on activity for underlying diseases you may have. SEEK MEDICAL CARE IF:   Your condition is not improving after your transfusion.  You develop redness or irritation at the intravenous (IV) site. SEEK IMMEDIATE MEDICAL CARE IF:  Any of the following symptoms occur over the next 12 hours:  Shaking chills.  You have a temperature by mouth  above 102 F (38.9 C), not controlled by medicine.  Chest, back, or muscle pain.  People around you feel you are not acting correctly or are confused.  Shortness of breath or difficulty breathing.  Dizziness and fainting.  You get a rash or develop hives.  You have a decrease in urine output.  Your urine turns a dark color  or changes to pink, red, or brown. Any of the following symptoms occur over the next 10 days:  You have a temperature by mouth above 102 F (38.9 C), not controlled by medicine.  Shortness of breath.  Weakness after normal activity.  The white part of the eye turns yellow (jaundice).  You have a decrease in the amount of urine or are urinating less often.  Your urine turns a dark color or changes to pink, red, or brown. Document Released: 11/21/2000 Document Revised: 02/16/2012 Document Reviewed: 07/10/2008 ExitCare Patient Information 2014 [place].  _______________________________________________________________________  Incentive Spirometer  An incentive spirometer is a tool that can help keep your lungs clear and active. This tool measures how well you are filling your lungs with each breath. Taking long deep breaths may help reverse or decrease the chance of developing breathing (pulmonary) problems (especially infection) following:  A long period of time when you are unable to move or be active. BEFORE THE PROCEDURE   If the spirometer includes an indicator to show your best effort, your nurse or respiratory therapist will set it to a desired goal.  If possible, sit up straight or lean slightly forward. Try not to slouch.  Hold the incentive spirometer in an upright position. INSTRUCTIONS FOR USE  1. Sit on the edge of your bed if possible, or sit up as far as you can in bed or on a chair. 2. Hold the incentive spirometer in an upright position. 3. Breathe out normally. 4. Place the mouthpiece in your mouth and seal your lips tightly  around it. 5. Breathe in slowly and as deeply as possible, raising the piston or the ball toward the top of the column. 6. Hold your breath for 3-5 seconds or for as long as possible. Allow the piston or ball to fall to the bottom of the column. 7. Remove the mouthpiece from your mouth and breathe out normally. 8. Rest for a few seconds and repeat Steps 1 through 7 at least 10 times every 1-2 hours when you are awake. Take your time and take a few normal breaths between deep breaths. 9. The spirometer may include an indicator to show your best effort. Use the indicator as a goal to work toward during each repetition. 10. After each set of 10 deep breaths, practice coughing to be sure your lungs are clear. If you have an incision (the cut made at the time of surgery), support your incision when coughing by placing a pillow or rolled up towels firmly against it. Once you are able to get out of bed, walk around indoors and cough well. You may stop using the incentive spirometer when instructed by your caregiver.  RISKS AND COMPLICATIONS  Take your time so you do not get dizzy or light-headed.  If you are in pain, you may need to take or ask for pain medication before doing incentive spirometry. It is harder to take a deep breath if you are having pain. AFTER USE  Rest and breathe slowly and easily.  It can be helpful to keep track of a log of your progress. Your caregiver can provide you with a simple table to help with this. If you are using the spirometer at home, follow these instructions: [place] IF:   You are having difficultly using the spirometer.  You have trouble using the spirometer as often as instructed.  Your pain medication is not giving enough relief while using the spirometer.  You develop fever of  100.5 F (38.1 C) or higher. SEEK IMMEDIATE MEDICAL CARE IF:   You cough up bloody sputum that had not been present before.  You develop fever of 102 F (38.9 C) or  greater.  You develop worsening pain at or near the incision site. MAKE SURE YOU:   Understand these instructions.  Will watch your condition.  Will get help right away if you are not doing well or get worse. Document Released: 04/06/2007 Document Revised: 02/16/2012 Document Reviewed: 06/07/2007 [facility] Patient Information 2014 [place], [place].   ________________________________________________________________________

## 2018-11-11 ENCOUNTER — Telehealth: Payer: Self-pay | Admitting: *Deleted

## 2018-11-11 ENCOUNTER — Telehealth: Payer: Self-pay | Admitting: Oncology

## 2018-11-11 NOTE — Telephone Encounter (Signed)
Patient called and requested something for nausea. Message forwarded to [facility] APP

## 2018-11-11 NOTE — Telephone Encounter (Signed)
Faxed referral for Dr. Delsa Sale to [facility].

## 2018-11-11 NOTE — Telephone Encounter (Signed)
Called Ms Oberry back to discuss nausea.  Stated that the offfice is closed and will follow up with her tomorrow. Suggested she push fluids and that there is a stomach virus going around .  Her fibroids would not be causing the nausea.

## 2018-11-15 ENCOUNTER — Telehealth: Payer: Self-pay | Admitting: Oncology

## 2018-11-15 ENCOUNTER — Encounter: Payer: Self-pay | Admitting: Oncology

## 2018-11-15 NOTE — Telephone Encounter (Signed)
Called the referral coordinator, Elmo Putt, with [facility].  She said they will be calling patient today for an appointment with Dr. Delsa Sale.  Called patient and let her know that she will be receiving a call today.  Also asked if she is still having nausea.  She said it is gone now.

## 2018-11-16 ENCOUNTER — Telehealth: Payer: Self-pay | Admitting: Oncology

## 2018-11-16 NOTE — Telephone Encounter (Signed)
Called Stephanie Austin back and let her know that her FMLA paperwork and CD of her MRI is available for her to pick up.  She verbalized understanding and agreement.

## 2018-11-16 NOTE — Telephone Encounter (Signed)
Called Stephanie Austin to see if she was given an appointment to see Dr. Delsa Sale.  She said she was given an appointment for 01/07/2019.  She also said her GYN, Dr. Alwyn Pea, has referred her to a doctor at [facility] and she has an appointment for 11/24/18.  She may cancel her appointment with Dr. Delsa Sale after her appointment on 11/24/18.

## 2018-11-17 ENCOUNTER — Inpatient Hospital Stay (HOSPITAL_COMMUNITY)
Admission: RE | Admit: 2018-11-17 | Discharge: 2018-11-17 | Disposition: A | Discharge status: Home / Self Care | Payer: Managed Care, Other (non HMO) | Source: Ambulatory Visit

## 2018-11-18 ENCOUNTER — Ambulatory Visit: Admit: 2018-11-18 | Payer: Managed Care, Other (non HMO) | Admitting: Gynecologic Oncology

## 2018-11-18 SURGERY — HYSTERECTOMY, TOTAL, ROBOT-ASSISTED, LAPAROSCOPIC, WITH BILATERAL SALPINGO-OOPHORECTOMY
Anesthesia: General

## 2018-11-26 ENCOUNTER — Telehealth: Payer: Self-pay | Admitting: Gynecologic Oncology

## 2018-11-26 NOTE — Telephone Encounter (Signed)
Returned call to patient. Left message for her to please call the office.

## 2018-11-29 ENCOUNTER — Other Ambulatory Visit: Payer: Self-pay | Admitting: Obstetrics and Gynecology

## 2018-11-29 ENCOUNTER — Telehealth: Payer: Self-pay | Admitting: Gynecologic Oncology

## 2018-11-29 DIAGNOSIS — D250 Submucous leiomyoma of uterus: Secondary | ICD-10-CM

## 2018-11-29 NOTE — Telephone Encounter (Signed)
Returned call to patient.  Left message asking her to please call the office back so her questions about surgery could be addressed.

## 2018-12-03 ENCOUNTER — Telehealth: Payer: Self-pay | Admitting: Gynecologic Oncology

## 2018-12-03 NOTE — Telephone Encounter (Signed)
Patient returned call to the office.  Stating that she saw a infertility specialist who is recommending having a uterine embolization prior to a myomectomy.  All questions answered. Dr. Denman George aware of the plan and agreeable with the recommendations.  Patient advised to call back to the office for any questions or concerns.

## 2018-12-13 ENCOUNTER — Ambulatory Visit: Payer: Managed Care, Other (non HMO) | Admitting: Gynecologic Oncology

## 2018-12-22 ENCOUNTER — Ambulatory Visit
Admission: RE | Admit: 2018-12-22 | Discharge: 2018-12-22 | Disposition: A | Discharge status: Home / Self Care | Payer: BLUE CROSS/BLUE SHIELD | Source: Ambulatory Visit | Attending: Obstetrics and Gynecology | Admitting: Obstetrics and Gynecology

## 2018-12-22 ENCOUNTER — Encounter: Payer: Self-pay | Admitting: *Deleted

## 2018-12-22 DIAGNOSIS — D250 Submucous leiomyoma of uterus: Secondary | ICD-10-CM

## 2018-12-22 HISTORY — PX: IR RADIOLOGIST EVAL & MGMT: IMG5224

## 2018-12-22 NOTE — Consult Note (Signed)
Chief Complaint: Patient was seen in consultation today for  Chief Complaint  Patient presents with  . Consult    Consult for [place]   at the request of [facility] [place]  Referring Physician(s): Governor Specking  History of Present Illness: Stephanie Austin is a 33 y.o. female referred for consultation regarding symptomatic uterine fibroids.  Patient was initially diagnosed with fibroids in 2016.  She complains primarily of abdominal/pelvic pressure, and other bulk symptoms including bloating, urinary frequency, and back pain.  She describes some dysmenorrhea.  Her menstrual cycles are every 25 days lasting 4-5 days, with 2.5 days of heavy bleeding with clots, requiring changing super plus tampons every 3 to 4 hours.  No intra-.  Bleeding.  She is on birth control pills.  No other fibroid therapies or surgery.  No history of gynecologic infection.   She is G0, P0, does wish to maintain fertility.  No perimenopausal symptoms.  Pap smear 08/27/2018 was negative.  Endometrial biopsy 11/01/2018 was benign.  Past Medical History:  Diagnosis Date  . Contraception management   . Fibroid uterus     Past Surgical History:  Procedure Laterality Date  . IR RADIOLOGIST EVAL & MGMT  12/22/2018    Allergies: Patient has no known allergies.  Medications: Prior to Admission medications   Medication Sig Start Date End Date Taking? Authorizing Provider  CAMILA 0.35 MG tablet  04/11/15  Yes [provider]  Multiple Vitamins-Minerals (MULTIVITAMIN GUMMIES ADULT) CHEW Chew 1 tablet by mouth daily.   Yes [provider]  naproxen (EC-NAPROXEN) 500 MG EC tablet Take 1 tablet (500 mg total) by mouth every 12 (twelve) hours. Do not take along with other anti-inflammatories such as advil or ibuprofen 10/26/18  Yes Pinn, Rogelio Seen, MD  Omega-3 Fatty Acids (FISH OIL ADULT GUMMIES PO) Take 2 tablets by mouth daily.   Yes [provider]     Family History  Problem Relation Age of  Onset  . Thyroid disease Mother     Social History   Socioeconomic History  . Marital status: Single    Spouse name: Not on file  . Number of children: Not on file  . Years of education: Not on file  . Highest education level: Not on file  Occupational History  . Not on file  Social Needs  . Financial resource strain: Not on file  . Food insecurity:    Worry: Not on file    Inability: Not on file  . Transportation needs:    Medical: Not on file    Non-medical: Not on file  Tobacco Use  . Smoking status: Never Smoker  . Smokeless tobacco: Never Used  Substance and Sexual Activity  . Alcohol use: Not Currently    Comment: 2 drinks a month  . Drug use: No  . Sexual activity: Not Currently    Birth control/protection: Pill  Lifestyle  . Physical activity:    Days per week: Not on file    Minutes per session: Not on file  . Stress: Not on file  Relationships  . Social connections:    Talks on phone: Not on file    Gets together: Not on file    Attends religious service: Not on file    Active member of club or organization: Not on file    Attends meetings of clubs or organizations: Not on file    Relationship status: Not on file  Other Topics Concern  . Not on file  Social History Narrative  . Not on file    ECOG Status: 1 - Symptomatic but completely ambulatory   Physical Exam  Vital Signs: BP 121/82   Pulse 81   Temp 98.3 F (36.8 C) (Oral)   Resp 14   Ht 5\' 3"  (1.6 m)   Wt 93 kg   SpO2 100%   BMI 36.31 kg/m   Constitutional: Oriented to person, place, and time. Well-developed and well-nourished. No distress.  Last Weight  Most recent update: 12/22/2018 10:36 AM   Weight  93 kg (205 lb)           HENT:  Head: Normocephalic and atraumatic.  Eyes: Conjunctivae and EOM are normal. Right eye exhibits no discharge. Left eye exhibits no discharge. No scleral icterus.  Neck: No JVD present.  Pulmonary/Chest: Effort normal. No stridor. No respiratory  distress.  Abdomen: obese, soft, non distended Neurological:  alert and oriented to person, place, and time.  Skin: Skin is warm and dry.  not diaphoretic.  Psychiatric:   normal mood and affect.   behavior is normal. Judgment and thought content normal.     Review of Systems Review of Systems: A 12 point ROS discussed and pertinent positives are indicated in the HPI above.  All other systems are negative.       Imaging: CT ABDOMEN AND PELVIS WITH CONTRAST  TECHNIQUE: Multidetector CT imaging of the abdomen and pelvis was performed using the standard protocol following bolus administration of intravenous contrast.  CONTRAST: 100 mL Isovue 370  COMPARISON: None.  FINDINGS: Lower chest: No acute abnormality.  Hepatobiliary: No focal liver abnormality is seen. No gallstones, gallbladder wall thickening, or biliary dilatation.  Pancreas: Unremarkable. No pancreatic ductal dilatation or surrounding inflammatory changes.  Spleen: Normal in size without focal abnormality.  Adrenals/Urinary Tract: Adrenal glands are unremarkable. Kidneys are normal, without renal calculi, focal lesion, or hydronephrosis. Bladder is unremarkable.  Stomach/Bowel: Stomach is within normal limits. Appendix appears normal. No evidence of bowel wall thickening, distention, or inflammatory changes. No pneumatosis, pneumoperitoneum or portal venous gas.  Vascular/Lymphatic: No significant vascular findings are present. No enlarged abdominal or pelvic lymph nodes.  Reproductive: Severely enlarged heterogeneous uterus with numerous masses with the overall uterus measuring approximately 8.9 x 17 x 13.9 cm concerning for a numerous leiomyomas versus leiomyosarcoma.  Other: No abdominal wall hernia or abnormality. No abdominopelvic ascites.  Musculoskeletal: No acute osseous abnormality. No aggressive osseous lesion.  IMPRESSION: 1. Normal appendix. 2. Severely enlarged heterogeneous uterus  with numerous masses with the overall uterus measuring approximately 8.9 x 17 x 13.9 cm concerning for a numerous leiomyomas with myxoid degeneration versus leiomyosarcoma.   Electronically Signed By: Kathreen Devoid On: 10/26/2018 12:17    MRI PELVIS WITHOUT AND WITH CONTRAST  TECHNIQUE: Multiplanar multisequence MR imaging of the pelvis was performed both before and after administration of intravenous contrast.  CONTRAST: 9 cc Gadavist  COMPARISON: CT of 10/26/2018  FINDINGS: Urinary Tract: Decompressed urinary bladder. No distal hydroureter.  Bowel: Normal pelvic bowel loops.  Vascular/Lymphatic: No pelvic aneurysm or adenopathy.  Reproductive:  Uterus: Measures 15.6 x 8.3 x 16.9 cm. (Volume = 1100 cm^3)  Dominant fibroids including within the right-side of the lower uterine segment at 8.5 x 7.3 x 8.0 cm. Off the right-side of the uterine fundus at 6.4 x 5.7 x 5.9 cm.  Intracavitary fibroids: None identified. Of note, the endometrium is not well visualized secondary to anatomic distortion from fibroids.  Pedunculated fibroids: Subserosal fibroid off the  fundus measures 2.9 x 4.5 cm including on image 12/5.  Fibroid contrast enhancement: Fibroids are primarily heterogeneously hypoenhancing, including on series 10902.  Right ovary: Normal, including on image 23/5.  Left ovary: Normal, including on image 27/5.  Other: Trace free pelvic fluid is likely physiologic.  Musculoskeletal: No acute osseous abnormality.  IMPRESSION: 1. Large volume uterine fibroids, as detailed above. 2. Trace free pelvic fluid is likely physiologic.   Electronically Signed By: Abigail Miyamoto M.D. On: 11/05/2018 08:27    Labs:  CBC: No results for input(s): WBC, HGB, HCT, PLT in the last 8760 hours.  COAGS: No results for input(s): INR, APTT in the last 8760 hours.  BMP: No results for input(s): NA, K, CL, CO2, GLUCOSE, BUN, CALCIUM, CREATININE, GFRNONAA, GFRAA in the last 8760  hours.  Invalid input(s): CMP  LIVER FUNCTION TESTS: No results for input(s): BILITOT, AST, ALT, ALKPHOS, PROT, ALBUMIN in the last 8760 hours.  TUMOR MARKERS: No results for input(s): AFPTM, CEA, CA199, CHROMGRNA in the last 8760 hours.  Assessment and Plan:  My impression is that this patient's Massively myomatous uterus probably significantly contributes to her pelvic bulk symptoms, bleeding symptoms and dysmenorrhea. We spent the majority of the consultation discussing the pathophysiology of uterine leiomyomata, natural history, anticipated  involution post menopause, and treatment options. We discussed myomectomy, hysterectomy, and uterine fibroid embolization. I described the technique of UFE, anticipated benefits, possible risks and complications including but not limited to bleeding, infection, vessel damage, nontarget embolization, and incomplete symptom relief. We discussed the 90% clinical success rate historically and at our experience with UFE for bleeding symptoms, 80% success for bulk symptoms.  We discussed the post procedure course and time course of symptom resolution. We discussed the need for continued gynecologic care.  She seemed to understand and did ask appropriate questions, which were answered.  Based on her evaluation thus far, I think she would be an appropriate candidate for uterine fibroid embolization because of her symptomatology and  uterine fibroids.  We discussed that UFE is not considered a fertility treatment.  While there are many cases of normal full-term pregnancies post uterine artery embolization and/or ligation, there is very low but nonzero risk of post embolization fibroid infection unresponsive to IV antibiotics which may necessitate urgent hysterectomy which would clearly adversely affect fertility.    I gave the patient some literature to review.  I gave her our number in case she has any other questions. SHould she choose to proceed, we can set her  up for elective uterine fibroid embolization under moderate sedation, with plans for overnight in-hospital observation.  Thank you for this interesting consult.  I greatly enjoyed meeting Stephanie Austin and look forward to participating in their care.  A copy of this report was sent to the requesting provider on this date.  Electronically Signed: Rickard Rhymes 12/22/2018, 12:47 PM   I spent a total of  40 Minutes   in face to face in clinical consultation, greater than 50% of which was counseling/coordinating care for symptomatic uterine fibroids.

## 2018-12-31 ENCOUNTER — Other Ambulatory Visit (HOSPITAL_COMMUNITY): Payer: Self-pay | Admitting: Interventional Radiology

## 2018-12-31 DIAGNOSIS — D259 Leiomyoma of uterus, unspecified: Secondary | ICD-10-CM

## 2019-01-23 ENCOUNTER — Other Ambulatory Visit: Payer: Self-pay | Admitting: Radiology

## 2019-01-25 ENCOUNTER — Ambulatory Visit (HOSPITAL_COMMUNITY)
Admission: RE | Admit: 2019-01-25 | Discharge: 2019-01-26 | Disposition: A | Discharge status: Home / Self Care | Payer: Self-pay | Source: Ambulatory Visit | Attending: Interventional Radiology | Admitting: Interventional Radiology

## 2019-01-25 ENCOUNTER — Ambulatory Visit (HOSPITAL_COMMUNITY)
Admission: RE | Admit: 2019-01-25 | Discharge: 2019-01-25 | Disposition: A | Discharge status: Home / Self Care | Payer: Self-pay | Source: Ambulatory Visit | Attending: Interventional Radiology | Admitting: Interventional Radiology

## 2019-01-25 ENCOUNTER — Encounter (HOSPITAL_COMMUNITY): Payer: Self-pay

## 2019-01-25 ENCOUNTER — Other Ambulatory Visit: Payer: Self-pay

## 2019-01-25 DIAGNOSIS — R350 Frequency of micturition: Secondary | ICD-10-CM | POA: Insufficient documentation

## 2019-01-25 DIAGNOSIS — R140 Abdominal distension (gaseous): Secondary | ICD-10-CM | POA: Insufficient documentation

## 2019-01-25 DIAGNOSIS — D259 Leiomyoma of uterus, unspecified: Principal | ICD-10-CM | POA: Insufficient documentation

## 2019-01-25 DIAGNOSIS — N920 Excessive and frequent menstruation with regular cycle: Secondary | ICD-10-CM | POA: Insufficient documentation

## 2019-01-25 DIAGNOSIS — M549 Dorsalgia, unspecified: Secondary | ICD-10-CM | POA: Insufficient documentation

## 2019-01-25 DIAGNOSIS — D251 Intramural leiomyoma of uterus: Secondary | ICD-10-CM | POA: Diagnosis present

## 2019-01-25 HISTORY — PX: IR EMBO TUMOR ORGAN ISCHEMIA INFARCT INC GUIDE ROADMAPPING: IMG5449

## 2019-01-25 HISTORY — PX: IR US GUIDE VASC ACCESS RIGHT: IMG2390

## 2019-01-25 HISTORY — PX: IR ANGIOGRAM SELECTIVE EACH ADDITIONAL VESSEL: IMG667

## 2019-01-25 HISTORY — PX: IR ANGIOGRAM PELVIS SELECTIVE OR SUPRASELECTIVE: IMG661

## 2019-01-25 LAB — CBC WITH DIFFERENTIAL/PLATELET
Abs Immature Granulocytes: 0.01 10*3/uL (ref 0.00–0.07)
Basophils Absolute: 0.1 10*3/uL (ref 0.0–0.1)
Basophils Relative: 1 %
EOS PCT: 5 %
Eosinophils Absolute: 0.3 10*3/uL (ref 0.0–0.5)
HCT: 43 % (ref 36.0–46.0)
Hemoglobin: 14.8 g/dL (ref 12.0–15.0)
Immature Granulocytes: 0 %
LYMPHS PCT: 39 %
Lymphs Abs: 2.6 10*3/uL (ref 0.7–4.0)
MCH: 32.5 pg (ref 26.0–34.0)
MCHC: 34.4 g/dL (ref 30.0–36.0)
MCV: 94.3 fL (ref 80.0–100.0)
Monocytes Absolute: 0.5 10*3/uL (ref 0.1–1.0)
Monocytes Relative: 7 %
Neutro Abs: 3.3 10*3/uL (ref 1.7–7.7)
Neutrophils Relative %: 48 %
Platelets: 272 10*3/uL (ref 150–400)
RBC: 4.56 MIL/uL (ref 3.87–5.11)
RDW: 11.5 % (ref 11.5–15.5)
WBC: 6.8 10*3/uL (ref 4.0–10.5)
nRBC: 0 % (ref 0.0–0.2)

## 2019-01-25 LAB — PROTIME-INR
INR: 0.84
Prothrombin Time: 11.5 seconds (ref 11.4–15.2)

## 2019-01-25 LAB — BASIC METABOLIC PANEL
Anion gap: 9 (ref 5–15)
BUN: 11 mg/dL (ref 6–20)
CO2: 22 mmol/L (ref 22–32)
CREATININE: 0.92 mg/dL (ref 0.44–1.00)
Calcium: 9.7 mg/dL (ref 8.9–10.3)
Chloride: 104 mmol/L (ref 98–111)
GFR calc Af Amer: >60 mL/min (ref >60)
GFR calc non Af Amer: >60 mL/min (ref >60)
Glucose, Bld: 107 mg/dL — ABNORMAL HIGH (ref 70–99)
Potassium: 4.1 mmol/L (ref 3.5–5.1)
Sodium: 135 mmol/L (ref 135–145)

## 2019-01-25 LAB — HCG, SERUM, QUALITATIVE: Preg, Serum: NEGATIVE

## 2019-01-25 MED ORDER — ONDANSETRON HCL 4 MG/2ML IJ SOLN
4.0000 mg | Freq: Four times a day (QID) | INTRAMUSCULAR | Status: DC | PRN
  Administered 2019-01-25 – 2019-01-26 (×2): 4 mg via INTRAVENOUS
  Filled 2019-01-25: qty 2

## 2019-01-25 MED ORDER — IOPAMIDOL (ISOVUE-300) INJECTION 61%: 100.0000 mL | Freq: Once | INTRAVENOUS | Status: DC | PRN

## 2019-01-25 MED ORDER — DOCUSATE SODIUM 100 MG PO CAPS
100.0000 mg | ORAL_CAPSULE | Freq: Two times a day (BID) | ORAL | Status: DC
  Administered 2019-01-25 – 2019-01-26 (×2): 100 mg via ORAL
  Filled 2019-01-25 (×2): qty 1

## 2019-01-25 MED ORDER — KETOROLAC TROMETHAMINE 30 MG/ML IJ SOLN
30.0000 mg | INTRAMUSCULAR | Status: AC
  Administered 2019-01-25: 30 mg via INTRAVENOUS

## 2019-01-25 MED ORDER — CEFAZOLIN SODIUM-DEXTROSE 2-4 GM/100ML-% IV SOLN
INTRAVENOUS | Status: AC
  Administered 2019-01-25: 2 g via INTRAVENOUS
  Filled 2019-01-25: qty 100

## 2019-01-25 MED ORDER — LIDOCAINE HCL 1 % IJ SOLN
INTRAMUSCULAR | Status: AC
  Filled 2019-01-25: qty 20

## 2019-01-25 MED ORDER — SODIUM CHLORIDE 0.9% FLUSH: 3.0000 mL | Freq: Two times a day (BID) | INTRAVENOUS | Status: DC

## 2019-01-25 MED ORDER — LIDOCAINE HCL (PF) 1 % IJ SOLN
INTRAMUSCULAR | Status: AC | PRN
  Administered 2019-01-25: 10 mL

## 2019-01-25 MED ORDER — SODIUM CHLORIDE 0.9 % IV SOLN
INTRAVENOUS | Status: DC
  Administered 2019-01-25: 08:00:00 via INTRAVENOUS

## 2019-01-25 MED ORDER — IOHEXOL 300 MG/ML  SOLN
100.0000 mL | Freq: Once | INTRAMUSCULAR | Status: AC | PRN
  Administered 2019-01-25: 70 mL via INTRA_ARTERIAL

## 2019-01-25 MED ORDER — HYDROCODONE-ACETAMINOPHEN 5-325 MG PO TABS
1.0000 | ORAL_TABLET | ORAL | Status: DC | PRN
  Administered 2019-01-25: 2 via ORAL
  Administered 2019-01-26: 1 via ORAL
  Filled 2019-01-25: qty 1
  Filled 2019-01-25: qty 2

## 2019-01-25 MED ORDER — NORETHINDRONE 0.35 MG PO TABS
0.3500 mg | ORAL_TABLET | Freq: Every day | ORAL | Status: DC
  Administered 2019-01-26: 0.35 mg via ORAL

## 2019-01-25 MED ORDER — LETROZOLE 2.5 MG PO TABS
2.5000 mg | ORAL_TABLET | Freq: Every day | ORAL | Status: DC
  Filled 2019-01-25 (×2): qty 1

## 2019-01-25 MED ORDER — MIDAZOLAM HCL 2 MG/2ML IJ SOLN
INTRAMUSCULAR | Status: AC | PRN
  Administered 2019-01-25 (×10): 1 mg via INTRAVENOUS

## 2019-01-25 MED ORDER — MIDAZOLAM HCL 2 MG/2ML IJ SOLN
INTRAMUSCULAR | Status: AC
  Filled 2019-01-25: qty 4

## 2019-01-25 MED ORDER — ONDANSETRON HCL 4 MG/2ML IJ SOLN
4.0000 mg | Freq: Four times a day (QID) | INTRAMUSCULAR | Status: DC | PRN
  Filled 2019-01-25: qty 2

## 2019-01-25 MED ORDER — PROMETHAZINE HCL 25 MG PO TABS: 25.0000 mg | ORAL_TABLET | Freq: Three times a day (TID) | ORAL | Status: DC | PRN

## 2019-01-25 MED ORDER — IOHEXOL 300 MG/ML  SOLN
100.0000 mL | Freq: Once | INTRAMUSCULAR | Status: AC | PRN
  Administered 2019-01-25: 28 mL via INTRA_ARTERIAL

## 2019-01-25 MED ORDER — SODIUM CHLORIDE 0.9% FLUSH: 3.0000 mL | INTRAVENOUS | Status: DC | PRN

## 2019-01-25 MED ORDER — PROMETHAZINE HCL 25 MG RE SUPP: 25.0000 mg | Freq: Three times a day (TID) | RECTAL | Status: DC | PRN

## 2019-01-25 MED ORDER — IBUPROFEN 800 MG PO TABS
800.0000 mg | ORAL_TABLET | Freq: Four times a day (QID) | ORAL | Status: DC
  Administered 2019-01-25 – 2019-01-26 (×3): 800 mg via ORAL
  Filled 2019-01-25 (×3): qty 1

## 2019-01-25 MED ORDER — DIPHENHYDRAMINE HCL 50 MG/ML IJ SOLN: 12.5000 mg | Freq: Four times a day (QID) | INTRAMUSCULAR | Status: DC | PRN

## 2019-01-25 MED ORDER — FENTANYL CITRATE (PF) 100 MCG/2ML IJ SOLN
INTRAMUSCULAR | Status: AC
  Filled 2019-01-25: qty 6

## 2019-01-25 MED ORDER — FENTANYL CITRATE (PF) 100 MCG/2ML IJ SOLN
INTRAMUSCULAR | Status: AC | PRN
  Administered 2019-01-25 (×4): 50 ug via INTRAVENOUS

## 2019-01-25 MED ORDER — SODIUM CHLORIDE 0.9 % IV SOLN: 250.0000 mL | INTRAVENOUS | Status: DC | PRN

## 2019-01-25 MED ORDER — NALOXONE HCL 0.4 MG/ML IJ SOLN: 0.4000 mg | INTRAMUSCULAR | Status: DC | PRN

## 2019-01-25 MED ORDER — HYDROMORPHONE 1 MG/ML IV SOLN
INTRAVENOUS | Status: DC
  Administered 2019-01-25: 1.6 mg via INTRAVENOUS
  Administered 2019-01-25: 3.3 mg via INTRAVENOUS
  Administered 2019-01-25: 0.6 mg via INTRAVENOUS
  Administered 2019-01-25: 30 mg via INTRAVENOUS
  Administered 2019-01-26: 2.6 mg via INTRAVENOUS
  Administered 2019-01-26: 1.8 mg via INTRAVENOUS
  Filled 2019-01-25: qty 25

## 2019-01-25 MED ORDER — KETOROLAC TROMETHAMINE 30 MG/ML IJ SOLN
INTRAMUSCULAR | Status: AC
  Filled 2019-01-25: qty 1

## 2019-01-25 MED ORDER — DIPHENHYDRAMINE HCL 12.5 MG/5ML PO ELIX
12.5000 mg | ORAL_SOLUTION | Freq: Four times a day (QID) | ORAL | Status: DC | PRN
  Filled 2019-01-25: qty 5

## 2019-01-25 MED ORDER — CEFAZOLIN SODIUM-DEXTROSE 2-4 GM/100ML-% IV SOLN
2.0000 g | INTRAVENOUS | Status: AC
  Administered 2019-01-25: 2 g via INTRAVENOUS

## 2019-01-25 MED ORDER — SODIUM CHLORIDE 0.9% FLUSH: 9.0000 mL | INTRAVENOUS | Status: DC | PRN

## 2019-01-25 MED ORDER — MIDAZOLAM HCL 2 MG/2ML IJ SOLN
INTRAMUSCULAR | Status: AC
  Filled 2019-01-25: qty 6

## 2019-01-25 NOTE — Procedures (Signed)
  Procedure: Bilat Uterine fibroid embolization  via R CFA EBL:   minimal Complications:  none immediate  See full dictation in [facility].  Dillard Cannon MD Main # [phone] Pager  [phone]

## 2019-01-25 NOTE — Progress Notes (Signed)
Patient ID: Stephanie Austin, female   DOB: 1986-09-28, 33 y.o.   MRN: [id number] S: Sleeping in bed. States some pelvic crampy pain, moderately well controlled with PCA. No side effects. Foley still in. Ate a small snack of crackers. No N/V. O:  R CFA entry site c/d/i. A:  Doing well post UFE P:  Continue PCA overnight, switch to PO in AM.   D/C Foley.   Prob home in AM after breakfast.

## 2019-01-25 NOTE — H&P (Signed)
Chief Complaint: Patient was seen in consultation today for symptomatic fibroids  Referring Physician(s): Hassell,Daniel  Supervising Physician: Arne Cleveland  Patient Status: [place]  History of Present Illness: Stephanie Austin is a 33 y.o. female with no significant past medical history who was diagnosed with uterine fibroids in 2016.  Patient has become symptomatic of her fibroids with complaint of bulk symptoms, bloating, urinary frequency, back pain, and heavy menstrual periods. She has met with Dr. Vernard Gambles in consultation to discuss treatment and management options.  He has undergone appropriate pre-procedure work-up including a Pap smear and endometrial biopsy.  She presents to [facility] for procedure today in her usual state of health.  She has been NPO.  She does not take blood thinners.   Past Medical History:  Diagnosis Date  . Contraception management   . Fibroid uterus     Past Surgical History:  Procedure Laterality Date  . IR RADIOLOGIST EVAL & MGMT  12/22/2018    Allergies: Patient has no known allergies.  Medications: Prior to Admission medications   Medication Sig Start Date End Date Taking? Authorizing Provider  CAMILA 0.35 MG tablet  04/11/15  Yes [provider]  Multiple Vitamins-Minerals (MULTIVITAMIN GUMMIES ADULT) CHEW Chew 1 tablet by mouth daily.   Yes [provider]  naproxen (EC-NAPROXEN) 500 MG EC tablet Take 1 tablet (500 mg total) by mouth every 12 (twelve) hours. Do not take along with other anti-inflammatories such as advil or ibuprofen 10/26/18  Yes Pinn, Rogelio Seen, MD  Omega-3 Fatty Acids (FISH OIL ADULT GUMMIES PO) Take 2 tablets by mouth daily.   Yes [provider]     Family History  Problem Relation Age of Onset  . Thyroid disease Mother     Social History   Socioeconomic History  . Marital status: Single    Spouse name: Not on file  . Number of children: Not on file  . Years of  education: Not on file  . Highest education level: Not on file  Occupational History  . Not on file  Social Needs  . Financial resource strain: Not on file  . Food insecurity:    Worry: Not on file    Inability: Not on file  . Transportation needs:    Medical: Not on file    Non-medical: Not on file  Tobacco Use  . Smoking status: Never Smoker  . Smokeless tobacco: Never Used  Substance and Sexual Activity  . Alcohol use: Not Currently    Comment: 2 drinks a month  . Drug use: No  . Sexual activity: Not Currently    Birth control/protection: Pill  Lifestyle  . Physical activity:    Days per week: Not on file    Minutes per session: Not on file  . Stress: Not on file  Relationships  . Social connections:    Talks on phone: Not on file    Gets together: Not on file    Attends religious service: Not on file    Active member of club or organization: Not on file    Attends meetings of clubs or organizations: Not on file    Relationship status: Not on file  Other Topics Concern  . Not on file  Social History Narrative  . Not on file    Review of Systems: A 12 point ROS discussed and pertinent positives are indicated in the HPI above.  All other systems are negative.  Review of Systems  Constitutional: Negative  for fatigue and fever.  Respiratory: Negative for cough and shortness of breath.   Cardiovascular: Negative for chest pain.  Gastrointestinal: Negative for abdominal pain.  Genitourinary: Negative for dyspareunia, dysuria and vaginal bleeding.  Musculoskeletal: Negative for back pain.  Psychiatric/Behavioral: Negative for behavioral problems and confusion.    Vital Signs: BP (!) 147/92 (BP Location: Right Arm)   Pulse (!) 103   Temp 98.1 F (36.7 C) (Oral)   Resp 16   Ht _0  (1.6 m)   Wt 205 lb (93 kg)   LMP 01/18/2019   SpO2 100%   BMI 36.31 kg/m   Physical Exam Vitals signs and nursing note reviewed.  Constitutional:      Appearance: Normal  appearance.  Neck:     Musculoskeletal: Normal range of motion and neck supple.  Cardiovascular:     Rate and Rhythm: Normal rate and regular rhythm.     Pulses: Normal pulses.     Comments: Distal pulses intact Pulmonary:     Effort: Pulmonary effort is normal.     Breath sounds: Normal breath sounds.  Abdominal:     General: There is no distension.     Palpations: Abdomen is soft.  Skin:    General: Skin is warm and dry.  Neurological:     General: No focal deficit present.     Mental Status: She is alert and oriented to person, place, and time.  Psychiatric:        Mood and Affect: Mood normal.        Behavior: Behavior normal.        Thought Content: Thought content normal.        Judgment: Judgment normal.    MD Evaluation Airway: WNL Heart: WNL Abdomen: WNL Chest/ Lungs: WNL ASA  Classification: 2 Mallampati/Airway Score: Two   Imaging: No results found.  Labs:  CBC: Recent Labs    01/25/19 0820  WBC 6.8  HGB 14.8  HCT 43.0  PLT 272    COAGS: Recent Labs    01/25/19 0820  INR 0.84    BMP: Recent Labs    01/25/19 0820  NA 135  K 4.1  CL 104  CO2 22  GLUCOSE 107*  BUN 11  CALCIUM 9.7  CREATININE 0.92  GFRNONAA >60  GFRAA >60    LIVER FUNCTION TESTS: No results for input(s): BILITOT, AST, ALT, ALKPHOS, PROT, ALBUMIN in the last 8760 hours.  TUMOR MARKERS: No results for input(s): AFPTM, CEA, CA199, CHROMGRNA in the last 8760 hours.  Assessment and Plan: Patient with past medical history of symptomatic uterine fibroids elects for uterine artery embolization. She has consulted with Dr. Vernard Gambles who approves patient for procedure.  Patient presents today in their usual state of health.  She has been NPO and is not currently on blood thinners.  She understands she will be admitted overnight for observation and understands the goals of her admission with likely discharge home tomorrow.   The Risks and benefits of embolization were  discussed with the patient including, but not limited to bleeding, infection, vascular injury, post operative pain, or contrast induced renal failure.  This procedure involves the use of X-rays and because of the nature of the planned procedure, it is possible that we will have prolonged use of X-ray fluoroscopy.  Potential radiation risks to you include (but are not limited to) the following: - A slightly elevated risk for cancer several years later in life. This risk is typically less than 0.5% percent.  This risk is low in comparison to the normal incidence of human cancer, which is 33% for women and 50% for men according to the [place]. - Radiation induced injury can include skin redness, resembling a rash, tissue breakdown / ulcers and hair loss (which can be temporary or permanent).  The likelihood of either of these occurring depends on the difficulty of the procedure and whether you are sensitive to radiation due to previous procedures, disease, or genetic conditions.  IF your procedure requires a prolonged use of radiation, you will be notified and given written instructions for further action. It is your responsibility to monitor the irradiated area for the 2 weeks following the procedure and to notify your physician if you are concerned that you have suffered a radiation induced injury.   All of the patient's questions were answered, patient is agreeable to proceed. Consent signed and in chart.  Thank you for this interesting consult.  I greatly enjoyed meeting Stephanie Austin and look forward to participating in their care.  A copy of this report was sent to the requesting provider on this date.  Electronically Signed: Docia Barrier, PA 01/25/2019, 8:52 AM   I spent a total of    25 Minutes in face to face in clinical consultation, greater than 50% of which was counseling/coordinating care for symptomatic uterine fibroid.

## 2019-01-25 NOTE — Discharge Instructions (Signed)
Uterine Artery Embolization for Fibroids, Care After °This sheet gives you information about how to care for yourself after your procedure. Your health care provider may also give you more specific instructions. If you have problems or questions, contact your health care provider. °What can I expect after the procedure? °After your procedure, it is common to have: °· Pelvic cramping. You will be given pain medicine. °· Nausea and vomiting. You may be given medicine to help relieve nausea. °Follow these instructions at home: °Incision care °· Follow instructions from your health care provider about how to take care of your incision. Make sure you: °? Wash your hands with soap and water before you change your bandage (dressing). If soap and water are not available, use hand sanitizer. °? Change your dressing as told by your health care provider. °· Check your incision area every day for signs of infection. Check for: °? More redness, swelling, or pain. °? More fluid or blood. °? Warmth. °? Pus or a bad smell. °Medicines ° °· Take over-the-counter and prescription medicines only as told by your health care provider. °· Do not take aspirin. It can cause bleeding. °· Do not drive for 24 hours if you were given a medicine to help you relax (sedative). °· Do not drive or use heavy machinery while taking prescription pain medicine. °General instructions °· Ask your health care provider when you can resume sexual activity. °· To prevent or treat constipation while you are taking prescription pain medicine, your health care provider may recommend that you: °? Drink enough fluid to keep your urine clear or pale yellow. °? Take over-the-counter or prescription medicines. °? Eat foods that are high in fiber, such as fresh fruits and vegetables, whole grains, and beans. °? Limit foods that are high in fat and processed sugars, such as fried and sweet foods. °Contact a health care provider if: °· You have a fever. °· You have more  redness, swelling, or pain around your incision site. °· You have more fluid or blood coming from your incision site. °· Your incision feels warm to the touch. °· You have pus or a bad smell coming from your incision. °· You have a rash. °· You have uncontrolled nausea or you cannot eat or drink anything without vomiting. °Get help right away if: °· You have trouble breathing. °· You have chest pain. °· You have severe abdominal pain. °· You have leg pain. °· You become dizzy and faint. °Summary °· After your procedure, it is common to have pelvic cramping. You will be given pain medicine. °· Follow instructions from your health care provider about how to take care of your incision. °· Check your incision area every day for signs of infection. °· Take over-the-counter and prescription medicines only as told by your health care provider. °This information is not intended to replace advice given to you by your health care provider. Make sure you discuss any questions you have with your health care provider. °Document Released: 09/14/2013 Document Revised: 02/26/2017 Document Reviewed: 02/26/2017 °Elsevier Interactive Patient Education © 2019 [employer] °Moderate Conscious Sedation, Adult, Care After °These instructions provide you with information about caring for yourself after your procedure. Your health care provider may also give you more specific instructions. Your treatment has been planned according to current medical practices, but problems sometimes occur. Call your health care provider if you have any problems or questions after your procedure. °What can I expect after the procedure? °After your procedure, it is   common: °· To feel sleepy for several hours. °· To feel clumsy and have poor balance for several hours. °· To have poor judgment for several hours. °· To vomit if you eat too soon. °Follow these instructions at home: °For at least 24 hours after the procedure: ° °· Do not: °? Participate in  activities where you could fall or become injured. °? Drive. °? Use heavy machinery. °? Drink alcohol. °? Take sleeping pills or medicines that cause drowsiness. °? Make important decisions or sign legal documents. °? Take care of children on your own. °· Rest. °Eating and drinking °· Follow the diet recommended by your health care provider. °· If you vomit: °? Drink water, juice, or soup when you can drink without vomiting. °? Make sure you have little or no nausea before eating solid foods. °General instructions °· Have a responsible adult stay with you until you are awake and alert. °· Take over-the-counter and prescription medicines only as told by your health care provider. °· If you smoke, do not smoke without supervision. °· Keep all follow-up visits as told by your health care provider. This is important. °Contact a health care provider if: °· You keep feeling nauseous or you keep vomiting. °· You feel light-headed. °· You develop a rash. °· You have a fever. °Get help right away if: °· You have trouble breathing. °This information is not intended to replace advice given to you by your health care provider. Make sure you discuss any questions you have with your health care provider. °Document Released: 09/14/2013 Document Revised: 04/28/2016 Document Reviewed: 03/15/2016 °Elsevier Interactive Patient Education © 2019 [employer] Inc[employer] ° °

## 2019-01-25 NOTE — Sedation Documentation (Signed)
Patient is resting comfortably with eyes closed, in NAD. 

## 2019-01-26 MED ORDER — ONDANSETRON HCL 4 MG/2ML IJ SOLN: 4.0000 mg | Freq: Four times a day (QID) | INTRAMUSCULAR | 0 refills | Status: DC | PRN

## 2019-01-26 MED ORDER — DOCUSATE SODIUM 100 MG PO CAPS: 100.0000 mg | ORAL_CAPSULE | Freq: Two times a day (BID) | ORAL | 0 refills | Status: DC

## 2019-01-26 MED ORDER — HYDROCODONE-ACETAMINOPHEN 5-325 MG PO TABS: 1.0000 | ORAL_TABLET | ORAL | 0 refills | Status: DC | PRN

## 2019-01-26 MED ORDER — IBUPROFEN 800 MG PO TABS: 800.0000 mg | ORAL_TABLET | Freq: Four times a day (QID) | ORAL | 0 refills | Status: DC

## 2019-01-26 MED ORDER — FAMOTIDINE 20 MG PO TABS
20.0000 mg | ORAL_TABLET | Freq: Once | ORAL | Status: AC
  Administered 2019-01-26: 20 mg via ORAL
  Filled 2019-01-26: qty 1

## 2019-01-26 MED ORDER — ONDANSETRON HCL 8 MG PO TABS: 8.0000 mg | ORAL_TABLET | Freq: Three times a day (TID) | ORAL | 0 refills | Status: DC | PRN

## 2019-01-26 NOTE — Progress Notes (Signed)
Wasted 16 mL of Dilaudid PCA in Stericycle.  PCA initiated in Peri-Op and not found on pxyis med list.  Witnessed with Dena Billet, RN

## 2019-01-26 NOTE — Discharge Summary (Addendum)
Patient ID: Stephanie Austin MRN: [id number] DOB/AGE: [birth year] 33 y.o.  Admit date: 01/25/2019 Discharge date: 01/26/2019  Supervising Physician: Stephanie Austin  Patient Status: Stephanie Austin - In-pt  Admission Diagnoses: Symptomatic uterine fibroids  Discharge Diagnoses: Symptomatic uterine fibroids, status post bilateral uterine artery embolization on 01/25/2019 Active Problems:   Uterine fibroid   Fibroids, intramural  Past Medical History:  Diagnosis Date  . Contraception management   . Fibroid uterus    Past Surgical History:  Procedure Laterality Date  . IR ANGIOGRAM PELVIS SELECTIVE OR SUPRASELECTIVE  01/25/2019  . IR ANGIOGRAM PELVIS SELECTIVE OR SUPRASELECTIVE  01/25/2019  . IR ANGIOGRAM SELECTIVE EACH ADDITIONAL VESSEL  01/25/2019  . IR ANGIOGRAM SELECTIVE EACH ADDITIONAL VESSEL  01/25/2019  . IR EMBO TUMOR ORGAN ISCHEMIA INFARCT INC GUIDE ROADMAPPING  01/25/2019  . IR RADIOLOGIST EVAL & MGMT  12/22/2018  . IR US GUIDE VASC ACCESS RIGHT  01/25/2019     Discharged Condition: good  Hospital Course: Stephanie Austin is a 33 year old female with history of symptomatic uterine fibroids who was seen in consultation by Dr. Vernard Austin on 12/22/2018 to discuss treatment options.  She was deemed an appropriate candidate for bilateral uterine artery embolization and presented to [facility] on [facility]/25/2019 for the procedure.  The procedure was performed without immediate complications and she was admitted for overnight observation for pain control. She was placed on Dilaudid PCA pump. She did fairly well overnight with expected mild pelvic cramping and intermittent nausea.  On the morning of discharge she was stable but continued to have some mild nausea.  She was given antiemetics as needed, Dilaudid PCA was discontinued and she was transitioned to oral narcotic therapy.  She was subsequently able to void, ambulate and tolerate her diet without difficulty.  She was deemed stable for discharge at  this time.  She was given prescriptions for Norco 5/325, #30, no refills, 1 to 2 tablets every 4-6 hours as needed for pain, Colace, Zofran as well as Advil.  She will continue her current home medications and instructed to avoid taking Advil and Naprosyn at the same time.  She will be scheduled for follow-up with Dr. Vernard Austin in the IR clinic in 3 to 4 weeks. She was told to contact our service with any additional questions or concerns.  Consults: none  Significant Diagnostic Studies: 01/25/19-glucose 107, creatinine 0.92, potassium 4.1, WBC 6.8, hemoglobin 14.8, platelets 272k, PT 11.5, INR 1.84, qualitative hCG negative    Treatments: Bilateral uterine artery embolization with IV conscious sedation on 01/25/2019  Discharge Exam: Blood pressure (!) 135/94, pulse 79, temperature 98.7 F (37.1 C), temperature source Oral, resp. rate 16, height 5\' 3"  (1.6 m), weight 201 lb 11.5 oz (91.5 kg), last menstrual period 01/18/2019, SpO2 97 %. Patient awake, alert.  Chest clear to auscultation bilaterally.  Heart with regular rate and rhythm.  Abdomen soft, positive bowel sounds, mild ant pelvic tenderness to palpation.  Puncture site right groin soft, clean, dry, nontender, no hematoma.  Intact distal pulses.  No lower extremity edema.   Disposition: Discharge disposition: 01-Home or Self Care       Discharge Instructions    Call MD for:  difficulty breathing, headache or visual disturbances   Complete by:  As directed    Call MD for:  extreme fatigue   Complete by:  As directed    Call MD for:  hives   Complete by:  As directed    Call MD for:  persistant dizziness or  light-headedness   Complete by:  As directed    Call MD for:  persistant nausea and vomiting   Complete by:  As directed    Call MD for:  redness, tenderness, or signs of infection (pain, swelling, redness, odor or green/yellow discharge around incision site)   Complete by:  As directed    Call MD for:  severe uncontrolled pain    Complete by:  As directed    Call MD for:  temperature >100.4   Complete by:  As directed    Change dressing (specify)   Complete by:  As directed    May apply new bandage to puncture site right groin daily for the next 2 to 3 days.  May wash site with soap and water.   Diet - low sodium heart healthy   Complete by:  As directed    Discharge instructions   Complete by:  As directed    DO NOT TAKE BOTH NAPROSYN AND ADVIL TOGETHER; DRINK PLENTY OF WATER; AVOID STRENUOUS ACTIVITY   Driving Restrictions   Complete by:  As directed    NO DRIVING FOR NEXT 24 HOURS OR AFTER TAKING NARCOTICS   Increase activity slowly   Complete by:  As directed    Lifting restrictions   Complete by:  As directed    No heavy lifting for the next 3 to 4 days   May shower / Bathe   Complete by:  As directed    May walk up steps   Complete by:  As directed    Sexual Activity Restrictions   Complete by:  As directed    No sexual intercourse for 1 week     Current Meds  Medication Sig  . CAMILA 0.35 MG tablet   . Multiple Vitamins-Minerals (MULTIVITAMIN GUMMIES ADULT) CHEW Chew 1 tablet by mouth daily.  . naproxen (EC-NAPROXEN) 500 MG EC tablet Take 1 tablet (500 mg total) by mouth every 12 (twelve) hours. Do not take along with other anti-inflammatories such as advil or ibuprofen  . Omega-3 Fatty Acids (FISH OIL ADULT GUMMIES PO) Take 2 tablets by mouth daily.   Take OP meds as prescribed     Follow-up Information    Stephanie Cleveland, MD Follow up.   Specialties:  Interventional Radiology, Radiology Why:  Radiology service will call you with follow-up appointment with Dr. Vernard Austin in the next 3 to 4 weeks.  Call [phone] or [phone] with any questions. Contact information: [place] [address] [phone]        Governor Specking, MD Follow up.   Specialty:  Obstetrics and Gynecology Why:  Follow-up with Dr. Kerin Perna as scheduled Contact  information: [address] [place] [place] [address] [phone]            Electronically Signed: D. Rowe Robert, PA-C 01/26/2019, 2:11 PM   I have spent less than 30 minutes discharging Stephanie Austin.

## 2019-02-23 ENCOUNTER — Other Ambulatory Visit: Payer: Self-pay

## 2019-08-08 ENCOUNTER — Other Ambulatory Visit: Payer: Self-pay | Admitting: Interventional Radiology

## 2019-08-08 DIAGNOSIS — D250 Submucous leiomyoma of uterus: Secondary | ICD-10-CM

## 2019-09-05 IMAGING — XA IR EMBO TUMOR ORGAN ISCHEMIA INFARCT INC GUIDE ROADMAPPING
10 series · 13 of 24 positions shown · IV contrast (IODINE)
Comparison: none

CLINICAL DATA: Symptomatic uterine fibroids

EXAM:
EXAM
BILATERAL UTERINE ARTERY EMBOLIZATION
TECHNIQUE: The procedure, risks, benefits, and alternatives were explained to
the patient. Questions regarding the procedure were encouraged and
answered. The patient understands and consents to the procedure. As
antibiotic prophylaxis, cefazolin 2 g was ordered pre-procedure and
administered intravenously within one hour of incision.An
appropriate skin entry site was determined under fluoroscopy. Skin
site was marked, prepped with Betadine, and draped in usual sterile
fashion, and infiltrated locally with 1% lidocaine.

[Series 1: care body 4 · 2 of 21 frames shown (1 of 10)]
[frame 4/21]
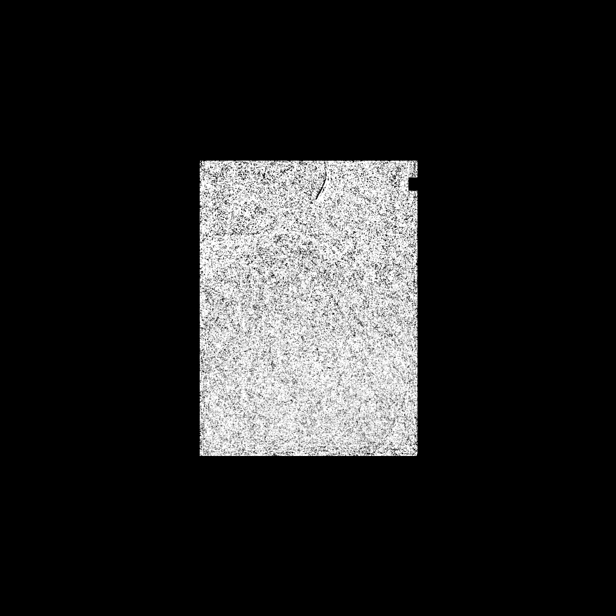
[frame 18/21]
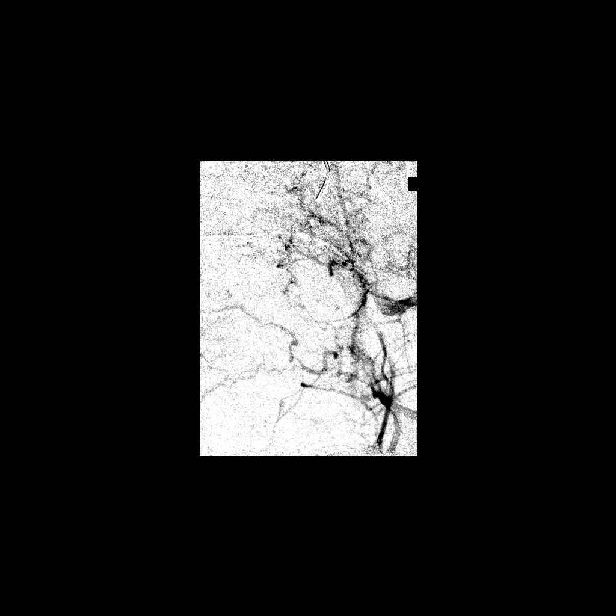

[Series 2: care body 4 · 1 of 16 frames shown (2 of 10)]
[frame 14/16]
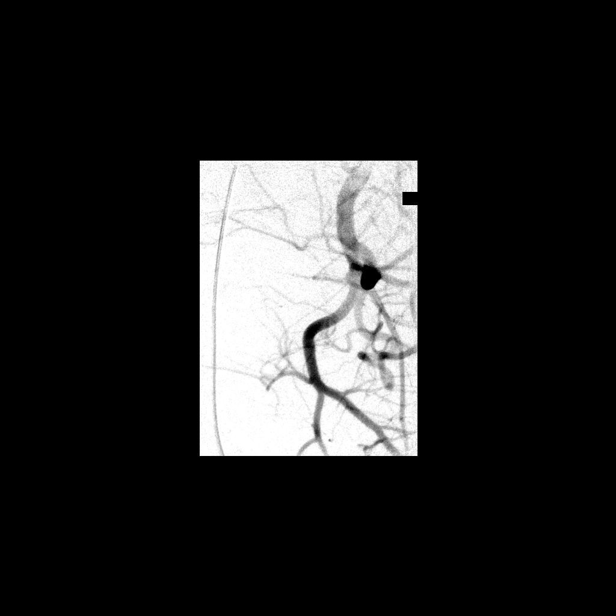

[Series 3: care body 4 · 1 of 17 frames shown (3 of 10)]
[frame 9/17]
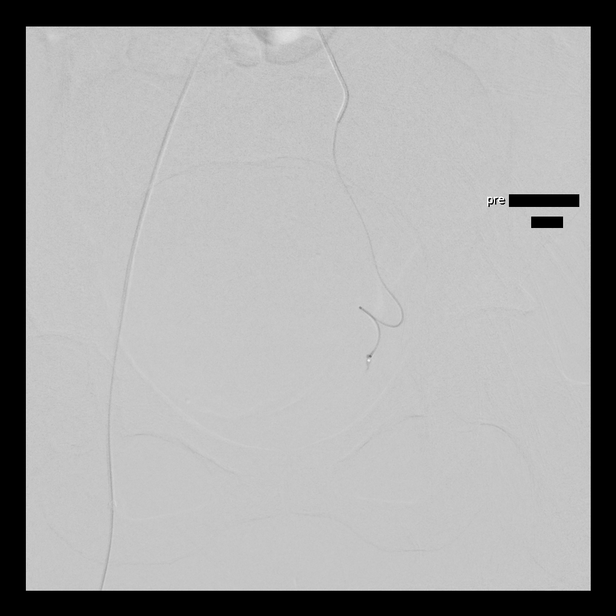

[Series 4: care body 4 · 1 of 28 frames shown (4 of 10)]
[frame 15/28]
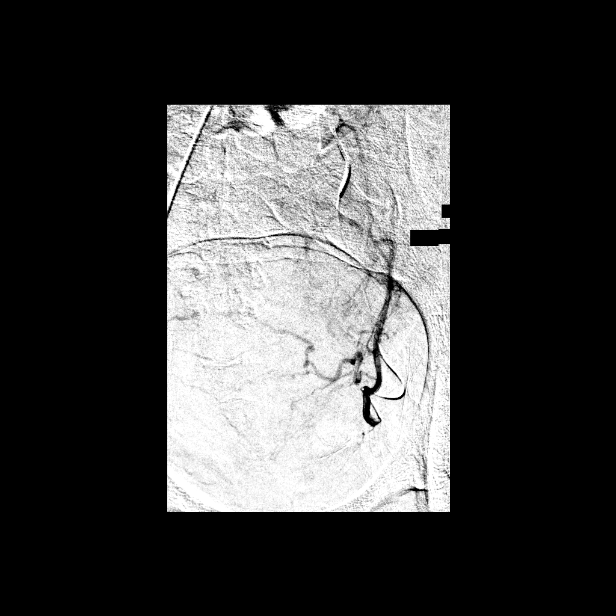

[Series 5: care body 4 · 1 of 36 frames shown (5 of 10)]
[frame 19/36]
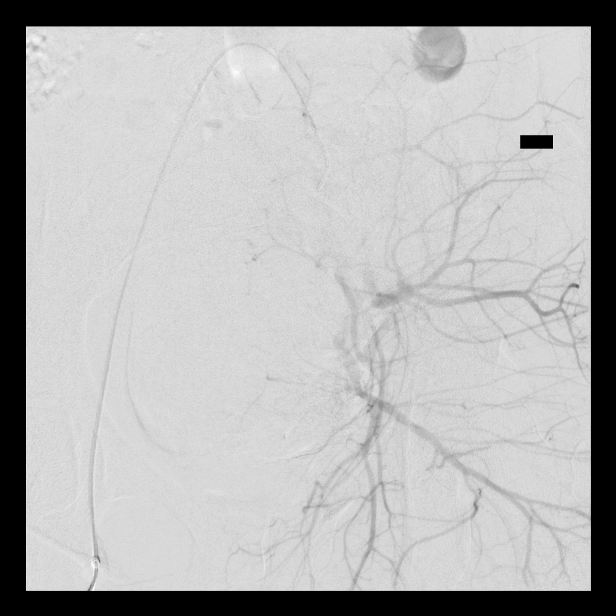

[Series 6: care body 4 · 2 of 25 frames shown (6 of 10)]
[frame 4/25]
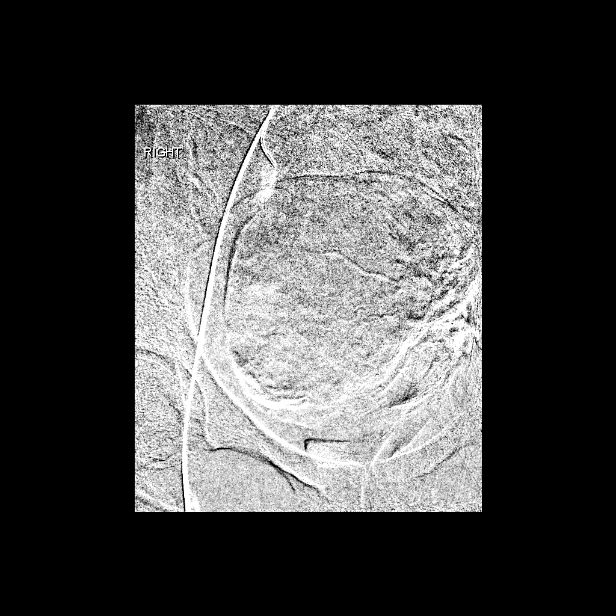
[frame 13/25]
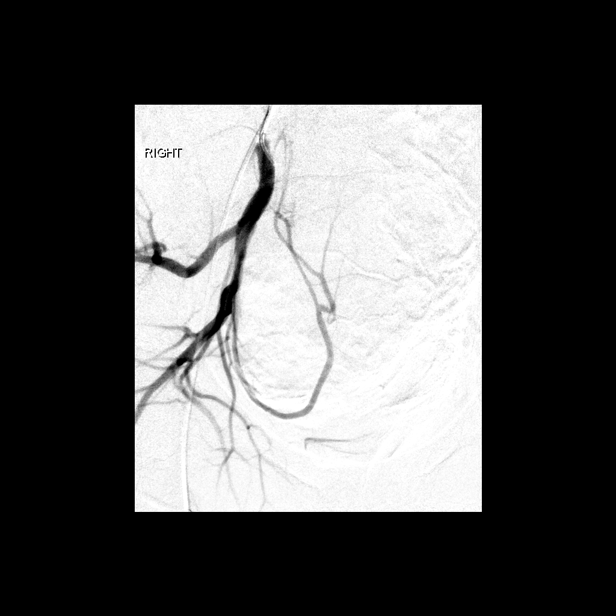

[Series 7: care body 4 · 1 of 15 frames shown (7 of 10)]
[frame 8/15]
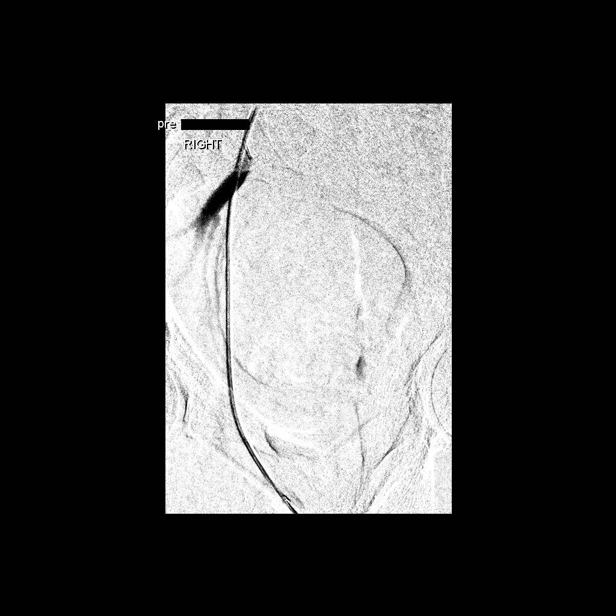

[Series 8: care body 4 · 1 of 28 frames shown (8 of 10)]
[frame 15/28]
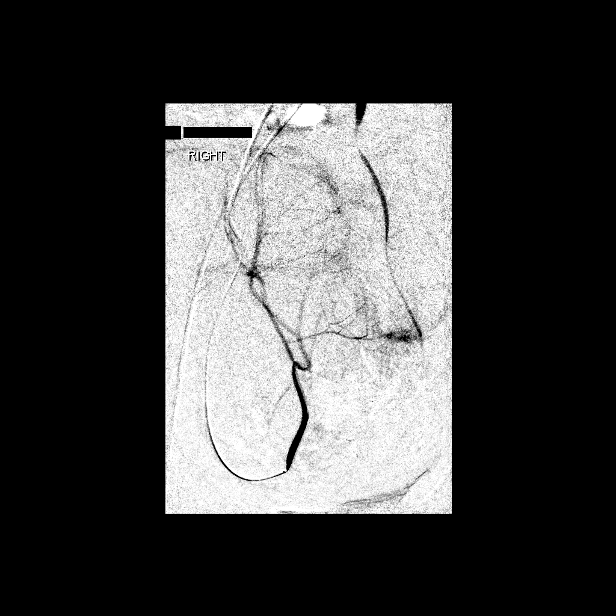

[Series 9: care body 4 · 1 of 40 frames shown (9 of 10)]
[frame 7/40]
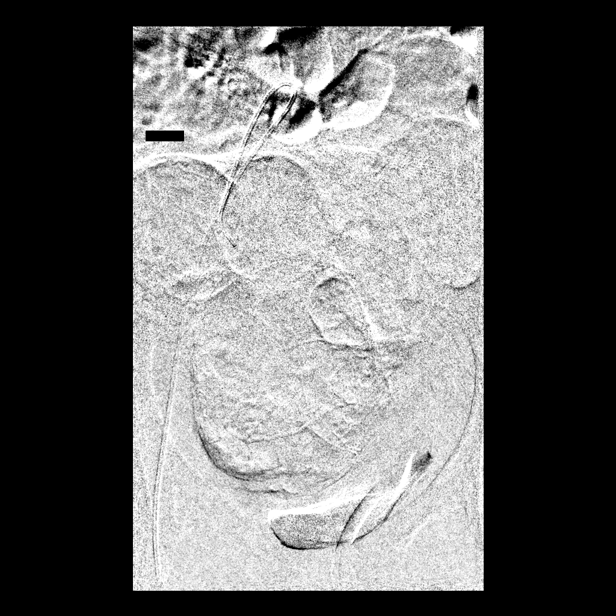

[Series 10: care body 4 · 2 of 15 frames shown (10 of 10)]
[frame 3/15]
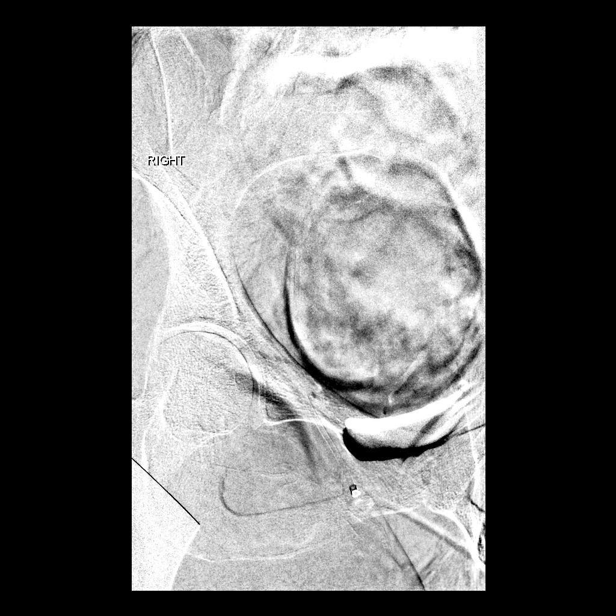
[frame 14/15]
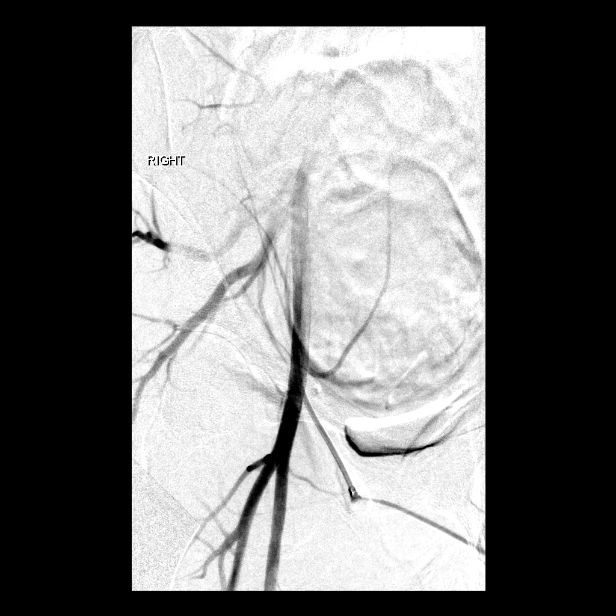

[13 of 24 positions shown; findings below may reference images not displayed]

Intravenous Fentanyl and Versed were administered as conscious
sedation during continuous monitoring of the patient's level of
consciousness and physiological / cardiorespiratory status by the
radiology RN, with a total moderate sedation time of 50 minutes.
Under real-time ultrasound guidance, the right common femoral artery
was accessed with a micropuncture needle set using singlewall
technique in a single pass. Ultrasound imaging documentation was
saved. Micropuncture dilator exchanged over a Benson wire for a 5
French vascular sheath through which a C2 catheter was used to
selectively catheterize the left internal iliac artery for pelvic
arteriography. A coaxial Progreat catheter was advanced with the
Terumo wire and used to selectively catheterize the left uterine
artery. The microcatheter tip was positioned in the distal
horizontal segment. Selective arteriogram confirms appropriate
positioning. Distal branches of the left uterine artery were
embolized with 500-700 micron Embospheres. Embolization continued
until near stasis of flow was achieved. Microcatheter was withdrawn
and a followup selective left internal iliac arteriogram was
obtained. [place] Tent loop was then formed with the C2 catheter, and
the right internal iliac artery was selectively catheterized. Again
the Progreat catheter with Terumo guidewire was coaxially advanced
and used to selectively catheterize the right uterine artery.
Confirmatory arteriogram was performed. Right uterine artery
branches were embolized with 500-700 micron Embospheres to near
stasis of flow. A total of 5 vials of Embospheres were utilized for
the case. Microcatheter was withdrawn and a followup arteriogram of
the right internal iliac artery was performed. C2 catheter removed.
After confirmatory femoral angiogram, sheath removed and using the
Exoseal device hemostasis was achieved. Patient tolerated the
procedure well.

FLUOROSCOPY TIME:  16.8 minute; [id number] DAP

COMPLICATIONS:
COMPLICATIONS
none
IMPRESSION: 1. Technically successful bilateral uterine artery embolization
using 500-700 micron Embospheres.

## 2021-06-14 LAB — RESULTS CONSOLE HPV: CHL HPV: POSITIVE

## 2021-06-14 LAB — HM PAP SMEAR

## 2021-06-18 DIAGNOSIS — B977 Papillomavirus as the cause of diseases classified elsewhere: Secondary | ICD-10-CM | POA: Insufficient documentation

## 2021-06-27 ENCOUNTER — Ambulatory Visit: Payer: 59 | Admitting: Podiatry

## 2021-06-27 ENCOUNTER — Encounter: Payer: Self-pay | Admitting: Podiatry

## 2021-06-27 ENCOUNTER — Other Ambulatory Visit: Payer: Self-pay

## 2021-06-27 ENCOUNTER — Ambulatory Visit (INDEPENDENT_AMBULATORY_CARE_PROVIDER_SITE_OTHER): Payer: 59

## 2021-06-27 ENCOUNTER — Other Ambulatory Visit: Payer: Self-pay | Admitting: Podiatry

## 2021-06-27 DIAGNOSIS — M2140 Flat foot [pes planus] (acquired), unspecified foot: Secondary | ICD-10-CM

## 2021-06-27 DIAGNOSIS — M722 Plantar fascial fibromatosis: Secondary | ICD-10-CM

## 2021-06-27 DIAGNOSIS — M778 Other enthesopathies, not elsewhere classified: Secondary | ICD-10-CM

## 2021-06-27 DIAGNOSIS — M76821 Posterior tibial tendinitis, right leg: Secondary | ICD-10-CM | POA: Diagnosis not present

## 2021-06-27 DIAGNOSIS — Z862 Personal history of diseases of the blood and blood-forming organs and certain disorders involving the immune mechanism: Secondary | ICD-10-CM | POA: Insufficient documentation

## 2021-06-27 DIAGNOSIS — G43909 Migraine, unspecified, not intractable, without status migrainosus: Secondary | ICD-10-CM | POA: Insufficient documentation

## 2021-06-27 DIAGNOSIS — M76822 Posterior tibial tendinitis, left leg: Secondary | ICD-10-CM

## 2021-06-27 MED ORDER — METHYLPREDNISOLONE 4 MG PO TBPK: ORAL_TABLET | ORAL | 0 refills | Status: DC

## 2021-06-27 MED ORDER — MELOXICAM 15 MG PO TABS: 15.0000 mg | ORAL_TABLET | Freq: Every day | ORAL | 3 refills | Status: DC

## 2021-06-27 NOTE — Patient Instructions (Addendum)
Plantar Fasciitis Plantar Fasciitis Rehab Ask your health care provider which exercises are safe for you. Do exercises exactly as told by your health care provider and adjust them as directed. It is normal to feel mild stretching, pulling, tightness, or discomfort as you do these exercises. Stop right away if you feel sudden pain or your pain gets worse. Do not begin these exercises until told by your health care provider. Stretching and range-of-motion exercises These exercises warm up your muscles and joints and improve the movement andflexibility of your foot. These exercises also help to relieve pain. Plantar fascia stretch  Sit with your left / right leg crossed over your opposite knee. Hold your heel with one hand with that thumb near your arch. With your other hand, hold your toes and gently pull them back toward the top of your foot. You should feel a stretch on the base (bottom) of your toes, or the bottom of your foot (plantar fascia), or both. Hold this stretch for__________ seconds. Slowly release your toes and return to the starting position. Repeat __________ times. Complete this exercise __________ times a day. Gastrocnemius stretch, standing This exercise is also called a calf (gastroc) stretch. It stretches the muscles in the back of the upper calf. Stand with your hands against a wall. Extend your left / right leg behind you, and bend your front knee slightly. Keeping your heels on the floor, your toes facing forward, and your back knee straight, shift your weight toward the wall. Do not arch your back. You should feel a gentle stretch in your upper calf. Hold this position for __________ seconds. Repeat __________ times. Complete this exercise __________ times a day. Soleus stretch, standing This exercise is also called a calf (soleus) stretch. It stretches the muscles in the back of the lower calf. Stand with your hands against a wall. Extend your left / right leg behind  you, and bend your front knee slightly. Keeping your heels on the floor and your toes facing forward, bend your back knee and shift your weight slightly over your back leg. You should feel a gentle stretch deep in your lower calf. Hold this position for __________ seconds. Repeat __________ times. Complete this exercise __________ times a day. Gastroc and soleus stretch, standing step This exercise stretches the muscles in the back of the lower leg. These muscles are in the upper calf (gastrocnemius) and the lower calf (soleus). Stand with the ball of your left / right foot on the front of a step. The ball of your foot is on the walking surface, right under your toes. Keep your other foot firmly on the same step. Hold on to the wall or a railing for balance. Slowly lift your other foot, allowing your body weight to press your heel down over the edge of the front of the step. Keep knee straight and unbent. You should feel a stretch in your calf. Hold this position for __________ seconds. Return both feet to the step. Repeat this exercise with a slight bend in your left / right knee. Repeat __________ times with your left / right knee straight and __________ times with your left / right knee bent. Complete this exercise __________ timesa day. Balance exercise This exercise builds your balance and strength control of your arch to helptake pressure off your plantar fascia. Single leg stand If this exercise is too easy, you can try it with your eyes closed or while standing on a pillow. Without shoes, stand near a railing  or in a doorway. You may hold on to the railing or door frame as needed. Stand on your left / right foot. Keep your big toe down on the floor and lift the arch of your foot. You should feel a stretch across the bottom of your foot and your arch. Do not let your foot roll inward. Hold this position for __________ seconds. Repeat __________ times. Complete this exercise __________  times a day. This information is not intended to replace advice given to you by your health care provider. Make sure you discuss any questions you have with your healthcare provider. Document Revised: 09/06/2020 Document Reviewed: 09/06/2020 Elsevier Patient Education  2022 [employer].  Plantar fasciitis is a painful foot condition that affects the heel. It occurs when the band of tissue that connects the toes to the heel bone (plantar fascia) becomes irritated. This can happen as the result of exercising too much or doing other repetitive activities (overuse injury). Plantar fasciitis can cause mild irritation to severe pain that makes it difficult to walk or move. The pain is usually worse in the morning after sleeping, or after sitting or lying down for a period of time. Pain may also beworse after long periods of walking or standing. What are the causes? This condition may be caused by: Standing for long periods of time. Wearing shoes that do not have good arch support. Doing activities that put stress on joints (high-impact activities). This includes ballet and exercise that makes your heart beat faster (aerobic exercise), such as running. Being overweight. An abnormal way of walking (gait). Tight muscles in the back of your lower leg (calf). High arches in your feet or flat feet. Starting a new athletic activity. What are the signs or symptoms? The main symptom of this condition is heel pain. Pain may get worse after the following: Taking the first steps after a time of rest, especially in the morning after awakening, or after you have been sitting or lying down for a while. Long periods of standing still. Pain may decrease after 30-45 minutes of activity, such as gentle walking. How is this diagnosed? This condition may be diagnosed based on your medical history, a physical exam, and your symptoms. Your health care provider will check for: A tender area on the bottom of your  foot. A high arch in your foot or flat feet. Pain when you move your foot. Difficulty moving your foot. You may have imaging tests to confirm the diagnosis, such as: X-rays. Ultrasound. MRI. How is this treated? Treatment for plantar fasciitis depends on how severe your condition is. Treatment may include: Rest, ice, pressure (compression), and raising (elevating) the affected foot. This is called RICE therapy. Your health care provider may recommend RICE therapy along with over-the-counter pain medicines to manage your pain. Exercises to stretch your calves and your plantar fascia. A splint that holds your foot in a stretched, upward position while you sleep (night splint). Physical therapy to relieve symptoms and prevent problems in the future. Injections of steroid medicine (cortisone) to relieve pain and inflammation. Stimulating your plantar fascia with electrical impulses (extracorporeal shock wave therapy). This is usually the last treatment option before surgery. Surgery, if other treatments have not worked after 12 months. Follow these instructions at home: Managing pain, stiffness, and swelling  If directed, put ice on the painful area. To do this: Put ice in a plastic bag, or use a frozen bottle of water. Place a towel between your skin and the  bag or bottle. Roll the bottom of your foot over the bag or bottle. Do this for 20 minutes, 2-3 times a day. Wear athletic shoes that have air-sole or gel-sole cushions, or try soft shoe inserts that are designed for plantar fasciitis. Elevate your foot above the level of your heart while you are sitting or lying down.  Activity Avoid activities that cause pain. Ask your health care provider what activities are safe for you. Do physical therapy exercises and stretches as told by your health care provider. Try activities and forms of exercise that are easier on your joints (low impact). Examples include swimming, water aerobics, and  biking. General instructions Take over-the-counter and prescription medicines only as told by your health care provider. Wear a night splint while sleeping, if told by your health care provider. Loosen the splint if your toes tingle, become numb, or turn cold and blue. Maintain a healthy weight, or work with your health care provider to lose weight as needed. Keep all follow-up visits. This is important. Contact a health care provider if you have: Symptoms that do not go away with home treatment. Pain that gets worse. Pain that affects your ability to move or do daily activities. Summary Plantar fasciitis is a painful foot condition that affects the heel. It occurs when the band of tissue that connects the toes to the heel bone (plantar fascia) becomes irritated. Heel pain is the main symptom of this condition. It may get worse after exercising too much or standing still for a long time. Treatment varies, but it usually starts with rest, ice, pressure (compression), and raising (elevating) the affected foot. This is called RICE therapy. Over-the-counter medicines can also be used to manage pain. This information is not intended to replace advice given to you by your health care provider. Make sure you discuss any questions you have with your healthcare provider. Document Revised: 03/12/2020 Document Reviewed: 03/12/2020 Elsevier Patient Education  2022 [employer].

## 2021-06-29 NOTE — Progress Notes (Signed)
  Subjective:  Patient ID: Stephanie Austin, female    DOB: 04/13/1986,  MRN: [id number] HPI Chief Complaint  Patient presents with   Foot Pain    Plantar heel and medial foot bilateral (R>L) - aching x 3 months, no AM pain, worse at end of day after on them at work, noticing tingling medial hallux bilateral, no treatment   New Patient (Initial Visit)    35 y.o. female presents with the above complaint.   ROS: Denies fever chills nausea vomiting muscle aches pains calf pain back pain chest pain shortness of breath.  Past Medical History:  Diagnosis Date   Contraception management    Fibroid uterus    Past Surgical History:  Procedure Laterality Date   IR ANGIOGRAM PELVIS SELECTIVE OR SUPRASELECTIVE  01/25/2019   IR ANGIOGRAM PELVIS SELECTIVE OR SUPRASELECTIVE  01/25/2019   IR ANGIOGRAM SELECTIVE EACH ADDITIONAL VESSEL  01/25/2019   IR ANGIOGRAM SELECTIVE EACH ADDITIONAL VESSEL  01/25/2019   IR EMBO TUMOR ORGAN ISCHEMIA INFARCT INC GUIDE ROADMAPPING  01/25/2019   IR RADIOLOGIST EVAL & MGMT  12/22/2018   IR US GUIDE VASC ACCESS RIGHT  01/25/2019    Current Outpatient Medications:    meloxicam (MOBIC) 15 MG tablet, Take 1 tablet (15 mg total) by mouth daily., Disp: 30 tablet, Rfl: 3   methylPREDNISolone (MEDROL DOSEPAK) 4 MG TBPK tablet, 6 day dose pack - take as directed, Disp: 21 tablet, Rfl: 0  No Known Allergies Review of Systems Objective:  There were no vitals filed for this visit.  General: Well developed, nourished, in no acute distress, alert and oriented x3   Dermatological: Skin is warm, dry and supple bilateral. Nails x 10 are well maintained; remaining integument appears unremarkable at this time. There are no open sores, no preulcerative lesions, no rash or signs of infection present.  Vascular: Dorsalis Pedis artery and Posterior Tibial artery pedal pulses are 2/4 bilateral with immedate capillary fill time. Pedal hair growth present. No varicosities and no lower  extremity edema present bilateral.   Neruologic: Grossly intact via light touch bilateral. Vibratory intact via tuning fork bilateral. Protective threshold with Semmes Wienstein monofilament intact to all pedal sites bilateral. Patellar and Achilles deep tendon reflexes 2+ bilateral. No Babinski or clonus noted bilateral.   Musculoskeletal: No gross boney pedal deformities bilateral. No pain, crepitus, or limitation noted with foot and ankle range of motion bilateral. Muscular strength 5/5 in all groups tested bilateral.  Pain is nonreproducible bilateral.  Gait: Unassisted, Nonantalgic.    Radiographs:  Radiographs taken today demonstrate an osseously mature individual bilateral mild flatfoot deformity is noted bilaterally.  Retrocalcaneal spurs noted left as opposed to right.  No thickening of the soft tissue tendons or ligaments that are visible.  No obvious acute findings.  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  Plan: At this point we will start her on a Medrol Dosepak to be followed by meloxicam.  We discussed getting her set up with [employer] for orthotics for her work boots.  I would like for her to see him and bring her boots or shoes to make sure that we can get appropriate orthotics made for her.  I would like to utilize an extrinsic rear foot post if possible I think this gives better control and I would like her subtalar joint to reach neutral.     Aleksei Goodlin T. [place], [place]

## 2021-06-30 DIAGNOSIS — M79672 Pain in left foot: Secondary | ICD-10-CM | POA: Insufficient documentation

## 2021-11-07 ENCOUNTER — Other Ambulatory Visit: Payer: Self-pay | Admitting: Obstetrics and Gynecology

## 2021-12-17 DIAGNOSIS — N871 Moderate cervical dysplasia: Secondary | ICD-10-CM | POA: Insufficient documentation

## 2022-02-07 ENCOUNTER — Other Ambulatory Visit: Payer: Self-pay | Admitting: Obstetrics and Gynecology

## 2022-03-03 ENCOUNTER — Encounter: Payer: Self-pay | Admitting: Internal Medicine

## 2023-01-21 ENCOUNTER — Encounter: Payer: Self-pay | Admitting: Podiatry

## 2023-01-21 ENCOUNTER — Ambulatory Visit (INDEPENDENT_AMBULATORY_CARE_PROVIDER_SITE_OTHER): Payer: No Typology Code available for payment source

## 2023-01-21 ENCOUNTER — Ambulatory Visit (INDEPENDENT_AMBULATORY_CARE_PROVIDER_SITE_OTHER): Payer: No Typology Code available for payment source | Admitting: Podiatry

## 2023-01-21 DIAGNOSIS — M778 Other enthesopathies, not elsewhere classified: Secondary | ICD-10-CM | POA: Diagnosis not present

## 2023-01-21 DIAGNOSIS — S93621A Sprain of tarsometatarsal ligament of right foot, initial encounter: Secondary | ICD-10-CM

## 2023-01-21 DIAGNOSIS — R221 Localized swelling, mass and lump, neck: Secondary | ICD-10-CM | POA: Insufficient documentation

## 2023-01-21 MED ORDER — MELOXICAM 15 MG PO TABS: 15.0000 mg | ORAL_TABLET | Freq: Every day | ORAL | 3 refills | Status: AC

## 2023-01-21 MED ORDER — METHYLPREDNISOLONE 4 MG PO TBPK: ORAL_TABLET | ORAL | 0 refills | Status: DC

## 2023-01-21 NOTE — Progress Notes (Signed)
She presents today with dorsal and dorsal lateral aspect of the right foot pain.  Denies any trauma to the foot from work.  States that she works in a [employer].  Can wear regular tennis shoes to work.  She states that when she goes up on her toes so she gets the pain in the bottom of the foot and occasionally just when she moves she has a sharp pain in that area.  As she points to the fourth fifth tarsometatarsal joint area.  Objective: Vital signs are stable she is alert and oriented x 3.  Pulses are palpable.  She has some tenderness on frontal plane range of motion of the fourth fifth tarsometatarsal joint.  Some tenderness on palpation of the fourth and fifth articulation themselves plantarly.  There is some tenderness on plantarflexion and eversion.  There is no erythema edema cellulitis drainage or odor.  Radiographs taken today do not demonstrate any significant osseous abnormalities other than mild pes planus.  Assessment: Probable stress or strain due to the pes planovalgus.  Fourth fifth tarsometatarsal joint.  Plan: Start her on methylprednisolone to be followed by meloxicam.  Discussed appropriate shoe gear I will follow-up with her in the near future.

## 2023-02-19 ENCOUNTER — Ambulatory Visit: Payer: No Typology Code available for payment source | Admitting: Podiatry

## 2023-09-01 ENCOUNTER — Other Ambulatory Visit: Payer: Self-pay | Admitting: Obstetrics and Gynecology

## 2023-09-03 LAB — SURGICAL PATHOLOGY

## 2024-10-11 ENCOUNTER — Ambulatory Visit: Payer: Self-pay | Admitting: Podiatry

## 2024-10-20 ENCOUNTER — Encounter: Payer: Self-pay | Admitting: Podiatry

## 2024-10-20 ENCOUNTER — Ambulatory Visit (INDEPENDENT_AMBULATORY_CARE_PROVIDER_SITE_OTHER): Payer: Self-pay | Admitting: Podiatry

## 2024-10-20 DIAGNOSIS — Q666 Other congenital valgus deformities of feet: Secondary | ICD-10-CM

## 2024-10-20 DIAGNOSIS — M722 Plantar fascial fibromatosis: Secondary | ICD-10-CM

## 2024-10-20 DIAGNOSIS — M76829 Posterior tibial tendinitis, unspecified leg: Secondary | ICD-10-CM

## 2024-10-20 DIAGNOSIS — S93621D Sprain of tarsometatarsal ligament of right foot, subsequent encounter: Secondary | ICD-10-CM

## 2024-10-21 NOTE — Progress Notes (Signed)
 Presents today chief complaint of right foot pain she is interested in orthotic she said the regular inserts she is still seeing the last long enough.  She is on her feet a lot at work  Objective: Vital signs are stable alert oriented x 3.  Pulses are palpable.  She has a flexible pes planovalgus bilateral with pain on palpation medial calcaneal tubercle of her right heel.  She also has posterior tibial tendon dysfunction.  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  Plan: Schedule her with [facility] for orthotics.

## 2024-11-09 ENCOUNTER — Telehealth: Payer: Self-pay | Admitting: Podiatry

## 2024-11-09 NOTE — Telephone Encounter (Signed)
Patient called to check on status of orthotics.

## 2024-11-09 NOTE — Telephone Encounter (Signed)
 Left message asking for a return call. The patients orthotics aren't in yet.

## 2024-11-10 ENCOUNTER — Ambulatory Visit: Payer: Self-pay | Admitting: Podiatry

## 2024-12-07 ENCOUNTER — Telehealth: Payer: Self-pay

## 2024-12-07 NOTE — Telephone Encounter (Signed)
 12/07/24 LVM letting pt know orthotics are in and to call back to schedule an appt
